# Patient Record
Sex: Female | Born: 2000 | Race: Black or African American | Hispanic: No | Marital: Single | Smoking: Never smoker
Health system: Southern US, Community
[De-identification: ages and names within clinical notes are randomized; demographics above are authoritative.]

---
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
## 2019-04-09 ENCOUNTER — Emergency Department (HOSPITAL_COMMUNITY)
Admission: EM | Admit: 2019-04-09 | Discharge: 2019-04-09 | Disposition: A | Discharge status: Home / Self Care | Payer: Self-pay | Attending: Emergency Medicine | Admitting: Emergency Medicine

## 2019-04-09 ENCOUNTER — Emergency Department (HOSPITAL_COMMUNITY): Payer: Self-pay

## 2019-04-09 ENCOUNTER — Encounter (HOSPITAL_COMMUNITY): Payer: Self-pay | Admitting: *Deleted

## 2019-04-09 DIAGNOSIS — W3400XA Accidental discharge from unspecified firearms or gun, initial encounter: Secondary | ICD-10-CM | POA: Insufficient documentation

## 2019-04-09 DIAGNOSIS — Z23 Encounter for immunization: Secondary | ICD-10-CM | POA: Insufficient documentation

## 2019-04-09 DIAGNOSIS — Y939 Activity, unspecified: Secondary | ICD-10-CM | POA: Insufficient documentation

## 2019-04-09 DIAGNOSIS — Y999 Unspecified external cause status: Secondary | ICD-10-CM | POA: Insufficient documentation

## 2019-04-09 DIAGNOSIS — Y929 Unspecified place or not applicable: Secondary | ICD-10-CM | POA: Insufficient documentation

## 2019-04-09 DIAGNOSIS — S42351B Displaced comminuted fracture of shaft of humerus, right arm, initial encounter for open fracture: Secondary | ICD-10-CM | POA: Insufficient documentation

## 2019-04-09 DIAGNOSIS — S42354B Nondisplaced comminuted fracture of shaft of humerus, right arm, initial encounter for open fracture: Secondary | ICD-10-CM

## 2019-04-09 LAB — CBC WITH DIFFERENTIAL/PLATELET
Abs Immature Granulocytes: 0.04 10*3/uL (ref 0.00–0.07)
Basophils Absolute: 0 10*3/uL (ref 0.0–0.1)
Basophils Relative: 0 %
Eosinophils Absolute: 0.1 10*3/uL (ref 0.0–0.5)
Eosinophils Relative: 1 %
HCT: 37.6 % (ref 36.0–46.0)
Hemoglobin: 12.6 g/dL (ref 12.0–15.0)
Immature Granulocytes: 0 %
Lymphocytes Relative: 31 %
Lymphs Abs: 3.3 10*3/uL (ref 0.7–4.0)
MCH: 32.1 pg (ref 26.0–34.0)
MCHC: 33.5 g/dL (ref 30.0–36.0)
MCV: 95.9 fL (ref 80.0–100.0)
Monocytes Absolute: 0.5 10*3/uL (ref 0.1–1.0)
Monocytes Relative: 5 %
Neutro Abs: 6.6 10*3/uL (ref 1.7–7.7)
Neutrophils Relative %: 63 %
Platelets: 226 10*3/uL (ref 150–400)
RBC: 3.92 MIL/uL (ref 3.87–5.11)
RDW: 11.9 % (ref 11.5–15.5)
WBC: 10.6 10*3/uL — ABNORMAL HIGH (ref 4.0–10.5)
nRBC: 0 % (ref 0.0–0.2)

## 2019-04-09 LAB — COMPREHENSIVE METABOLIC PANEL
ALT: 12 U/L (ref 0–44)
AST: 20 U/L (ref 15–41)
Albumin: 4 g/dL (ref 3.5–5.0)
Alkaline Phosphatase: 56 U/L (ref 38–126)
Anion gap: 14 (ref 5–15)
BUN: 20 mg/dL (ref 6–20)
CO2: 19 mmol/L — ABNORMAL LOW (ref 22–32)
Calcium: 8.8 mg/dL — ABNORMAL LOW (ref 8.9–10.3)
Chloride: 104 mmol/L (ref 98–111)
Creatinine, Ser: 0.87 mg/dL (ref 0.44–1.00)
GFR calc Af Amer: >60 mL/min (ref >60)
GFR calc non Af Amer: >60 mL/min (ref >60)
Glucose, Bld: 143 mg/dL — ABNORMAL HIGH (ref 70–99)
Potassium: 3 mmol/L — ABNORMAL LOW (ref 3.5–5.1)
Sodium: 137 mmol/L (ref 135–145)
Total Bilirubin: 0.5 mg/dL (ref 0.3–1.2)
Total Protein: 6.9 g/dL (ref 6.5–8.1)

## 2019-04-09 LAB — HCG, QUANTITATIVE, PREGNANCY: hCG, Beta Chain, Quant, S: <1 m[IU]/mL (ref <5)

## 2019-04-09 LAB — PROTIME-INR
INR: 1 (ref 0.8–1.2)
Prothrombin Time: 13.4 seconds (ref 11.4–15.2)

## 2019-04-09 MED ORDER — CEFAZOLIN SODIUM-DEXTROSE 1-4 GM/50ML-% IV SOLN
1.0000 g | Freq: Once | INTRAVENOUS | Status: AC
  Administered 2019-04-09: 1 g via INTRAVENOUS
  Filled 2019-04-09: qty 50

## 2019-04-09 MED ORDER — OXYCODONE-ACETAMINOPHEN 5-325 MG PO TABS: 2.0000 | ORAL_TABLET | ORAL | 0 refills | Status: DC | PRN

## 2019-04-09 MED ORDER — LIDOCAINE-EPINEPHRINE 1 %-1:100000 IJ SOLN: 20.0000 mL | Freq: Once | INTRAMUSCULAR | Status: DC

## 2019-04-09 MED ORDER — TETANUS-DIPHTH-ACELL PERTUSSIS 5-2.5-18.5 LF-MCG/0.5 IM SUSP
0.5000 mL | Freq: Once | INTRAMUSCULAR | Status: AC
  Administered 2019-04-09: 0.5 mL via INTRAMUSCULAR
  Filled 2019-04-09: qty 0.5

## 2019-04-09 MED ORDER — FENTANYL CITRATE (PF) 100 MCG/2ML IJ SOLN
50.0000 ug | Freq: Once | INTRAMUSCULAR | Status: AC
  Administered 2019-04-09: 50 ug via INTRAVENOUS
  Filled 2019-04-09: qty 2

## 2019-04-09 MED ORDER — HYDROMORPHONE HCL 1 MG/ML IJ SOLN
0.5000 mg | Freq: Once | INTRAMUSCULAR | Status: AC
  Administered 2019-04-09: 0.5 mg via INTRAVENOUS
  Filled 2019-04-09: qty 1

## 2019-04-09 MED ORDER — LIDOCAINE-EPINEPHRINE (PF) 2 %-1:200000 IJ SOLN
20.0000 mL | Freq: Once | INTRAMUSCULAR | Status: AC
  Administered 2019-04-09: 20 mL via INTRADERMAL

## 2019-04-09 MED ORDER — CEPHALEXIN 500 MG PO CAPS: 500.0000 mg | ORAL_CAPSULE | Freq: Four times a day (QID) | ORAL | 0 refills | Status: DC

## 2019-04-09 NOTE — Consult Note (Signed)
Responded to page, pt not available, no family present, prayed for pt, staff will page again if further need of chaplain services.   Rev. Gennette Shadix Chaplain 
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
## 2019-04-09 NOTE — Progress Notes (Signed)
Orthopedic Tech Progress Note Patient Details:  Lisa Carney 2001-01-30 [id number]  Ortho Devices Type of Ortho Device: Post (long arm) splint, Arm sling Ortho Device/Splint Location: rue Ortho Device/Splint Interventions: Ordered, Application, Adjustment  I applied the splint from hand to on top of shoulder as directed to by dr Magnus Ivan. Post Interventions Patient Tolerated: Well Instructions Provided: Care of device, Adjustment of device   [name] Post 04/09/2019, 4:56 AM

## 2019-04-10 ENCOUNTER — Encounter (HOSPITAL_COMMUNITY): Payer: Self-pay | Admitting: *Deleted

## 2019-04-18 ENCOUNTER — Other Ambulatory Visit: Payer: Self-pay

## 2019-04-18 ENCOUNTER — Ambulatory Visit (INDEPENDENT_AMBULATORY_CARE_PROVIDER_SITE_OTHER): Payer: Self-pay

## 2019-04-18 ENCOUNTER — Encounter: Payer: Self-pay | Admitting: Orthopaedic Surgery

## 2019-04-18 ENCOUNTER — Ambulatory Visit (INDEPENDENT_AMBULATORY_CARE_PROVIDER_SITE_OTHER): Payer: Self-pay | Admitting: Orthopaedic Surgery

## 2019-04-18 DIAGNOSIS — S42351A Displaced comminuted fracture of shaft of humerus, right arm, initial encounter for closed fracture: Secondary | ICD-10-CM | POA: Insufficient documentation
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
## 2019-08-11 ENCOUNTER — Encounter (HOSPITAL_COMMUNITY): Payer: Self-pay | Admitting: Emergency Medicine

## 2019-08-11 ENCOUNTER — Emergency Department (HOSPITAL_COMMUNITY)
Admission: EM | Admit: 2019-08-11 | Discharge: 2019-08-12 | Disposition: A | Discharge status: Home / Self Care | Payer: Medicaid Other | Attending: Emergency Medicine | Admitting: Emergency Medicine

## 2019-08-11 ENCOUNTER — Emergency Department (HOSPITAL_COMMUNITY): Payer: Medicaid Other

## 2019-08-11 ENCOUNTER — Other Ambulatory Visit: Payer: Self-pay

## 2019-08-11 DIAGNOSIS — Z5321 Procedure and treatment not carried out due to patient leaving prior to being seen by health care provider: Secondary | ICD-10-CM | POA: Diagnosis not present

## 2019-08-11 DIAGNOSIS — R0981 Nasal congestion: Secondary | ICD-10-CM | POA: Insufficient documentation

## 2019-08-11 LAB — CBC WITH DIFFERENTIAL/PLATELET
Abs Immature Granulocytes: 0.04 10*3/uL (ref 0.00–0.07)
Basophils Absolute: 0 10*3/uL (ref 0.0–0.1)
Basophils Relative: 0 %
Eosinophils Absolute: 0.3 10*3/uL (ref 0.0–0.5)
Eosinophils Relative: 3 %
HCT: 40.6 % (ref 36.0–46.0)
Hemoglobin: 14 g/dL (ref 12.0–15.0)
Immature Granulocytes: 0 %
Lymphocytes Relative: 12 %
Lymphs Abs: 1.2 10*3/uL (ref 0.7–4.0)
MCH: 31.9 pg (ref 26.0–34.0)
MCHC: 34.5 g/dL (ref 30.0–36.0)
MCV: 92.5 fL (ref 80.0–100.0)
Monocytes Absolute: 0.7 10*3/uL (ref 0.1–1.0)
Monocytes Relative: 7 %
Neutro Abs: 7.7 10*3/uL (ref 1.7–7.7)
Neutrophils Relative %: 78 %
Platelets: 175 10*3/uL (ref 150–400)
RBC: 4.39 MIL/uL (ref 3.87–5.11)
RDW: 12 % (ref 11.5–15.5)
WBC: 9.9 10*3/uL (ref 4.0–10.5)
nRBC: 0 % (ref 0.0–0.2)

## 2019-08-11 LAB — BASIC METABOLIC PANEL
Anion gap: 12 (ref 5–15)
BUN: 13 mg/dL (ref 6–20)
CO2: 21 mmol/L — ABNORMAL LOW (ref 22–32)
Calcium: 9.3 mg/dL (ref 8.9–10.3)
Chloride: 104 mmol/L (ref 98–111)
Creatinine, Ser: 1.03 mg/dL — ABNORMAL HIGH (ref 0.44–1.00)
GFR calc Af Amer: >60 mL/min (ref >60)
GFR calc non Af Amer: >60 mL/min (ref >60)
Glucose, Bld: 80 mg/dL (ref 70–99)
Potassium: 3.4 mmol/L — ABNORMAL LOW (ref 3.5–5.1)
Sodium: 137 mmol/L (ref 135–145)

## 2019-08-11 LAB — I-STAT BETA HCG BLOOD, ED (MC, WL, AP ONLY): I-stat hCG, quantitative: 6.3 m[IU]/mL — ABNORMAL HIGH (ref <5)

## 2019-08-11 NOTE — ED Triage Notes (Signed)
Patient reports chills , nasal congestion , dry cough with loss of smell and taste onset this week .

## 2019-08-12 NOTE — ED Notes (Signed)
Pt called multiple times for vs no answer
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
## 2019-11-18 ENCOUNTER — Encounter (HOSPITAL_COMMUNITY): Payer: Self-pay

## 2019-11-18 ENCOUNTER — Other Ambulatory Visit: Payer: Self-pay

## 2019-11-18 ENCOUNTER — Inpatient Hospital Stay (HOSPITAL_COMMUNITY)
Admission: AD | Admit: 2019-11-18 | Discharge: 2019-11-20 | DRG: 885 | Disposition: A | Discharge status: Home / Self Care | Payer: Medicaid Other | Source: Intra-hospital | Attending: Psychiatry | Admitting: Psychiatry

## 2019-11-18 ENCOUNTER — Emergency Department (HOSPITAL_COMMUNITY)
Admission: EM | Admit: 2019-11-18 | Discharge: 2019-11-18 | Disposition: A | Discharge status: Other Acute Inpatient Hospital | Payer: Medicaid Other | Attending: Emergency Medicine | Admitting: Emergency Medicine

## 2019-11-18 DIAGNOSIS — Z046 Encounter for general psychiatric examination, requested by authority: Secondary | ICD-10-CM

## 2019-11-18 DIAGNOSIS — Z915 Personal history of self-harm: Secondary | ICD-10-CM

## 2019-11-18 DIAGNOSIS — R45851 Suicidal ideations: Secondary | ICD-10-CM | POA: Insufficient documentation

## 2019-11-18 DIAGNOSIS — Z9119 Patient's noncompliance with other medical treatment and regimen: Secondary | ICD-10-CM

## 2019-11-18 DIAGNOSIS — F419 Anxiety disorder, unspecified: Secondary | ICD-10-CM | POA: Diagnosis present

## 2019-11-18 DIAGNOSIS — F1590 Other stimulant use, unspecified, uncomplicated: Secondary | ICD-10-CM | POA: Diagnosis not present

## 2019-11-18 DIAGNOSIS — F322 Major depressive disorder, single episode, severe without psychotic features: Secondary | ICD-10-CM | POA: Insufficient documentation

## 2019-11-18 DIAGNOSIS — Z20822 Contact with and (suspected) exposure to covid-19: Secondary | ICD-10-CM | POA: Diagnosis present

## 2019-11-18 DIAGNOSIS — Y636 Underdosing and nonadministration of necessary drug, medicament or biological substance: Secondary | ICD-10-CM | POA: Diagnosis not present

## 2019-11-18 DIAGNOSIS — T391X2A Poisoning by 4-Aminophenol derivatives, intentional self-harm, initial encounter: Secondary | ICD-10-CM

## 2019-11-18 DIAGNOSIS — R258 Other abnormal involuntary movements: Secondary | ICD-10-CM | POA: Insufficient documentation

## 2019-11-18 DIAGNOSIS — T391X5A Adverse effect of 4-Aminophenol derivatives, initial encounter: Secondary | ICD-10-CM | POA: Diagnosis not present

## 2019-11-18 DIAGNOSIS — F332 Major depressive disorder, recurrent severe without psychotic features: Secondary | ICD-10-CM | POA: Diagnosis not present

## 2019-11-18 DIAGNOSIS — T1491XA Suicide attempt, initial encounter: Secondary | ICD-10-CM

## 2019-11-18 LAB — COMPREHENSIVE METABOLIC PANEL
ALT: 13 U/L (ref 0–44)
AST: 17 U/L (ref 15–41)
Albumin: 4.3 g/dL (ref 3.5–5.0)
Alkaline Phosphatase: 64 U/L (ref 38–126)
Anion gap: 13 (ref 5–15)
BUN: 18 mg/dL (ref 6–20)
CO2: 21 mmol/L — ABNORMAL LOW (ref 22–32)
Calcium: 9.6 mg/dL (ref 8.9–10.3)
Chloride: 106 mmol/L (ref 98–111)
Creatinine, Ser: 0.98 mg/dL (ref 0.44–1.00)
GFR calc Af Amer: >60 mL/min (ref >60)
GFR calc non Af Amer: >60 mL/min (ref >60)
Glucose, Bld: 79 mg/dL (ref 70–99)
Potassium: 3.5 mmol/L (ref 3.5–5.1)
Sodium: 140 mmol/L (ref 135–145)
Total Bilirubin: 0.7 mg/dL (ref 0.3–1.2)
Total Protein: 7.7 g/dL (ref 6.5–8.1)

## 2019-11-18 LAB — CBC
HCT: 43.7 % (ref 36.0–46.0)
Hemoglobin: 14.8 g/dL (ref 12.0–15.0)
MCH: 31.4 pg (ref 26.0–34.0)
MCHC: 33.9 g/dL (ref 30.0–36.0)
MCV: 92.8 fL (ref 80.0–100.0)
Platelets: 191 10*3/uL (ref 150–400)
RBC: 4.71 MIL/uL (ref 3.87–5.11)
RDW: 11.9 % (ref 11.5–15.5)
WBC: 6.9 10*3/uL (ref 4.0–10.5)
nRBC: 0 % (ref 0.0–0.2)

## 2019-11-18 LAB — RAPID URINE DRUG SCREEN, HOSP PERFORMED
Amphetamines: NOT DETECTED
Barbiturates: NOT DETECTED
Benzodiazepines: NOT DETECTED
Cocaine: NOT DETECTED
Opiates: NOT DETECTED
Tetrahydrocannabinol: POSITIVE — AB

## 2019-11-18 LAB — SALICYLATE LEVEL: Salicylate Lvl: <7 mg/dL — ABNORMAL LOW (ref 7.0–30.0)

## 2019-11-18 LAB — I-STAT BETA HCG BLOOD, ED (MC, WL, AP ONLY): I-stat hCG, quantitative: <5 m[IU]/mL (ref <5)

## 2019-11-18 LAB — ACETAMINOPHEN LEVEL: Acetaminophen (Tylenol), Serum: 11 ug/mL (ref 10–30)

## 2019-11-18 LAB — SARS CORONAVIRUS 2 BY RT PCR (HOSPITAL ORDER, PERFORMED IN ~~LOC~~ HOSPITAL LAB): SARS Coronavirus 2: NEGATIVE

## 2019-11-18 LAB — MAGNESIUM: Magnesium: 1.7 mg/dL (ref 1.7–2.4)

## 2019-11-18 LAB — ETHANOL: Alcohol, Ethyl (B): <10 mg/dL (ref <10)

## 2019-11-18 MED ORDER — HYDROXYZINE HCL 25 MG PO TABS
25.0000 mg | ORAL_TABLET | Freq: Once | ORAL | Status: AC
  Administered 2019-11-18: 25 mg via ORAL
  Filled 2019-11-18 (×2): qty 1

## 2019-11-18 MED ORDER — SODIUM CHLORIDE 0.9 % IV BOLUS
1000.0000 mL | Freq: Once | INTRAVENOUS | Status: AC
  Administered 2019-11-18: 1000 mL via INTRAVENOUS

## 2019-11-18 MED ORDER — ALUM & MAG HYDROXIDE-SIMETH 200-200-20 MG/5ML PO SUSP: 30.0000 mL | ORAL | Status: DC | PRN

## 2019-11-18 MED ORDER — MAGNESIUM HYDROXIDE 400 MG/5ML PO SUSP: 30.0000 mL | Freq: Every day | ORAL | Status: DC | PRN

## 2019-11-18 MED ORDER — ACETAMINOPHEN 325 MG PO TABS: 650.0000 mg | ORAL_TABLET | Freq: Four times a day (QID) | ORAL | Status: DC | PRN

## 2019-11-18 MED ORDER — HALOPERIDOL LACTATE 5 MG/ML IJ SOLN
10.0000 mg | Freq: Once | INTRAMUSCULAR | Status: AC
  Administered 2019-11-18: 10 mg via INTRAMUSCULAR
  Filled 2019-11-18: qty 2

## 2019-11-18 NOTE — ED Provider Notes (Signed)
Medical Decision Making: Care of patient assumed from Dr. Rush Landmark at 1500.  Agree with history, physical exam and plan.  See their note for further details.  Briefly, The pt p/w self-harm with attempt of suicide by overdose of Tylenol and Benadryl, now medically cleared, behavioral health has IVC this patient is attempting to place them.   Current plan is as follows: IVC in place, attempting to de-escalate this patient, she is confrontational with nursing staff threatening to leave pacing in the hallways, she is offered oral medications to help calm her down but she declines she will be giving intramuscular injection of Haldol to cooperate and stay calm.  TTS has evaluated this patient, they deem her necessary for admission to [facility].   I personally reviewed and interpreted all labs/imaging.      Sabino Donovan, MD 11/18/19 2141

## 2019-11-18 NOTE — ED Notes (Signed)
Report called to Marisue Ivan, Charity fundraiser at [facility]

## 2019-11-18 NOTE — ED Notes (Signed)
[facility] left with pt

## 2019-11-18 NOTE — ED Notes (Signed)
PT request MD or RN to phone mother [phone] (tammy)

## 2019-11-18 NOTE — ED Notes (Signed)
[facility] called for transport of pt to [facility] Rm 300.

## 2019-11-18 NOTE — ED Notes (Signed)
Pt spoken to about her transfer to [facility]

## 2019-11-18 NOTE — ED Triage Notes (Signed)
Patient BIBEMS due to a suicide attempt, wrote a suicide note at 1930 9/17, called 911 twice, once at 0530 but hung up and again at 0600, EMS estimates that 5000mg  of tylenol and 2500mg  of benadryl was taken, patient arrives lethargic but alert gcs of 12
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
## 2019-11-18 NOTE — ED Notes (Signed)
Pt has been cooperative for the duration of this shift. No needs or concerns identified. Sitter has been at bedside

## 2019-11-18 NOTE — ED Notes (Signed)
Telehealth speaking with pt at this time

## 2019-11-18 NOTE — ED Notes (Addendum)
Pt belongings: [place] ID, Blk iphone, blk bractlet, blue shoes, blk and green pants,  Sport bra, white shirt, set gold earring

## 2019-11-18 NOTE — ED Provider Notes (Signed)
[facility]-EMERGENCY DEPT Provider Note   CSN: [id number] Arrival date & time: 11/18/19  6720     History Chief Complaint  Patient presents with  . Suicidal  . Drug Overdose    Lisa Carney is a 19 y.o. female.  The history is provided by the patient and medical records. No language interpreter was used.  Drug Overdose This is a new problem. The current episode started yesterday. The problem occurs constantly. The problem has been gradually improving. Pertinent negatives include no chest pain, no abdominal pain, no headaches and no shortness of breath. Nothing aggravates the symptoms. Nothing relieves the symptoms. She has tried nothing for the symptoms. The treatment provided no relief.           History reviewed. No pertinent past medical history.  Patient Active Problem List   Diagnosis Date Noted  . Closed displaced comminuted fracture of shaft of right humerus 04/18/2019    History reviewed. No pertinent surgical history.   OB History   No obstetric history on file.     Family History  Problem Relation Age of Onset  . Healthy Mother     Social History   Tobacco Use  . Smoking status: Never Smoker  . Smokeless tobacco: Never Used  Substance Use Topics  . Alcohol use: Yes  . Drug use: Yes    Types: Marijuana    Home Medications Prior to Admission medications   Medication Sig Start Date End Date Taking? Authorizing Provider  cephALEXin (KEFLEX) 500 MG capsule Take 1 capsule (500 mg total) by mouth 4 (four) times daily. 04/09/19   Mesner, Barbara Cower, MD  ipratropium (ATROVENT) 0.06 % nasal spray Place 2 sprays into both nostrils 4 (four) times daily. 10/25/17   Georgetta Haber, NP  oxyCODONE-acetaminophen (PERCOCET) 5-325 MG tablet Take 2 tablets by mouth every 4 (four) hours as needed. 04/09/19   Mesner, Barbara Cower, MD    Allergies    Patient has no known allergies.  Review of Systems   Review of Systems  Constitutional: Positive for  fatigue. Negative for chills, diaphoresis and fever.  HENT: Negative for congestion.   Eyes: Negative for visual disturbance.  Respiratory: Negative for cough, chest tightness, shortness of breath and wheezing.   Cardiovascular: Negative for chest pain, palpitations and leg swelling.  Gastrointestinal: Negative for abdominal pain, constipation, diarrhea, nausea and vomiting.  Genitourinary: Negative for dysuria and flank pain.  Musculoskeletal: Negative for back pain, neck pain and neck stiffness.  Skin: Negative for rash and wound.  Neurological: Negative for dizziness, syncope, speech difficulty, weakness, light-headedness and headaches.  Psychiatric/Behavioral: Negative for agitation and confusion.  All other systems reviewed and are negative.   Physical Exam Updated Vital Signs BP 138/82 (BP Location: Right Arm)   Pulse 92   Temp 98.5 F (36.9 C) (Oral)   Resp 18   Ht 5\' 9"  (1.753 m)   Wt 66 kg   SpO2 100%   BMI 21.49 kg/m   Physical Exam Vitals and nursing note reviewed.  Constitutional:      General: She is not in acute distress.    Appearance: She is well-developed. She is not ill-appearing, toxic-appearing or diaphoretic.  HENT:     Head: Normocephalic and atraumatic.     Nose: No congestion or rhinorrhea.     Mouth/Throat:     Mouth: Mucous membranes are moist.     Pharynx: No oropharyngeal exudate or posterior oropharyngeal erythema.  Eyes:     Extraocular Movements:  Extraocular movements intact.     Conjunctiva/sclera: Conjunctivae normal.     Pupils: Pupils are equal, round, and reactive to light.  Cardiovascular:     Rate and Rhythm: Normal rate and regular rhythm.     Pulses: Normal pulses.     Heart sounds: No murmur heard.   Pulmonary:     Effort: Pulmonary effort is normal. No respiratory distress.     Breath sounds: Normal breath sounds. No wheezing, rhonchi or rales.  Chest:     Chest wall: No tenderness.  Abdominal:     General: Abdomen is  flat.     Palpations: Abdomen is soft.     Tenderness: There is no abdominal tenderness. There is no right CVA tenderness, left CVA tenderness, guarding or rebound.  Musculoskeletal:        General: No tenderness.     Cervical back: Neck supple. No tenderness.     Right lower leg: No edema.     Left lower leg: No edema.  Skin:    General: Skin is warm and dry.     Capillary Refill: Capillary refill takes less than 2 seconds.     Findings: No erythema.  Neurological:     General: No focal deficit present.     Mental Status: She is alert.     Sensory: No sensory deficit.     Motor: No weakness.  Psychiatric:        Attention and Perception: Perception normal.        Mood and Affect: Mood is depressed.        Behavior: Behavior is withdrawn.        Thought Content: Thought content includes suicidal ideation. Thought content does not include homicidal ideation. Thought content includes suicidal plan. Thought content does not include homicidal plan.     ED Results / Procedures / Treatments   Labs (all labs ordered are listed, but only abnormal results are displayed) Labs Reviewed  COMPREHENSIVE METABOLIC PANEL - Abnormal; Notable for the following components:      Result Value   CO2 21 (*)    All other components within normal limits  SALICYLATE LEVEL - Abnormal; Notable for the following components:   Salicylate Lvl <7.0 (*)    All other components within normal limits  SARS CORONAVIRUS 2 BY RT PCR (HOSPITAL ORDER, PERFORMED IN [facility])  ETHANOL  ACETAMINOPHEN LEVEL  CBC  MAGNESIUM  RAPID URINE DRUG SCREEN, HOSP PERFORMED  I-STAT BETA HCG BLOOD, ED ([facility], [facility], [facility] ONLY)    EKG None ED ECG REPORT   Date: 11/18/2019  Rate: 65  Rhythm: normal sinus rhythm  QRS Axis: normal  Intervals: normal  ST/T Wave abnormalities: normal  Conduction Disutrbances:none  Narrative Interpretation:   Old EKG Reviewed: unchanged  I have personally reviewed the EKG tracing  and agree with the computerized printout as noted. .    Radiology No results found.  Procedures Procedures (including critical care time)  Medications Ordered in ED Medications  sodium chloride 0.9 % bolus 1,000 mL (0 mLs Intravenous Stopped 11/18/19 1128)    ED Course  I have reviewed the triage vital signs and the nursing notes.  Pertinent labs & imaging results that were available during my care of the patient were reviewed by me and considered in my medical decision making (see chart for details).    MDM Rules/Calculators/A&P  Jatoria Kneeland is a 19 y.o. female with no documented past medical history who presents with suicide attempt.  According to EMS report and nursing, patient took 5000 g of Tylenol and 2500 mg of Benadryl last night and has been sleepy this morning.  According to them, she called 911 twice this morning at 530 and 6 AM before them responding.  She will not answer questions but will shake her head yes and no to answers.  She wrote a suicide note which I have attached a picture of.  The note was written at 7:19 PM last night and she says around 730, just after finishing the note is when she took the medications.  She went to bed and after waking up call for help.  She currently denies any complaints aside from being tired.  She shakes her head no to any headache, neck pain, neck stiffness, chest pain, shortness of breath, nausea, vomiting, constipation, diarrhea, dysuria.  She denies hallucinations or homicidal ideation but does shake her head yes that she has had depression.  On exam, patient's vital signs are reassuring and she is not tachycardic or hypoxic.  She is not hypotensive.  She is resting comfortably.  On exam, she did have 1 or 2 beats of nystagmus but otherwise her pupils were not dilated and reacted normally.  Lungs clear and chest nontender.  Abdomen nontender.  No other focal neurologic deficits on initial exam.  No injury  seen.  I called poison control who reports that as she is around 12 hours since ingestion, and given her reassuring vital signs and EKG, they feel that if her labs returned reassuring, she is likely safe for medical clearance.  We added on all the blood work requested.  We will use a Tylenol level onto the nomogram.  EKG did not show STEMI.  I am placing her under IVC given this attempt with clear intent and reported depression.  Anticipate medical clearance after labs have returned.   Patient's work-up is returned and is reassuring.  Covid test is negative.  She is not pregnant.  Magnesium and metabolic panel reassuring.  CBC shows no leukocytosis or anemia.  Tylenol level is 11 and on the nomogram, is not other concerning level based on the timeframe ingestion.  Salicylate undetectable, alcohol undetectable.  Patient is now felt to be medically cleared given her reassuring vital signs and labs now being nearly 16 hours from ingestion.   Final Clinical Impression(s) / ED Diagnoses Final diagnoses:  Intentional acetaminophen overdose, initial encounter (HCC)  Suicide attempt [facility])  Involuntary commitment    Clinical Impression: 1. Intentional acetaminophen overdose, initial encounter (HCC)   2. Suicide attempt (HCC)   3. Involuntary commitment     Disposition: Awaiting TTS recommendations under IVC currently.  This note was prepared with assistance of Conservation officer, historic buildings. Occasional wrong-word or sound-a-like substitutions may have occurred due to the inherent limitations of voice recognition software.      Paisley Grajeda, Canary Brim, MD 11/18/19 [phone]

## 2019-11-18 NOTE — Tx Team (Signed)
Initial Treatment Plan 11/18/2019 11:39 PM Lisa Carney PGF:[id number]    PATIENT STRESSORS: Legal issue Marital or family conflict Traumatic event   PATIENT STRENGTHS: Average or above average intelligence Communication skills Supportive family/friends Work skills   PATIENT IDENTIFIED PROBLEMS: Depression  Suicidal Ideation          "anger management"         DISCHARGE CRITERIA:  Improved stabilization in mood, thinking, and/or behavior Motivation to continue treatment in a less acute level of care Need for constant or close observation no longer present Verbal commitment to aftercare and medication compliance  PRELIMINARY DISCHARGE PLAN: Outpatient therapy Placement in alternative living arrangements Return to previous work or school arrangements  PATIENT/FAMILY INVOLVEMENT: This treatment plan has been presented to and reviewed with the patient, Lisa Carney,.  The patient and family have been given the opportunity to ask questions and make suggestions.  Juliann Pares, RN 11/18/2019, 11:39 PM

## 2019-11-19 MED ORDER — HYDROXYZINE HCL 25 MG PO TABS
25.0000 mg | ORAL_TABLET | Freq: Three times a day (TID) | ORAL | Status: DC | PRN
  Administered 2019-11-19: 25 mg via ORAL
  Filled 2019-11-19: qty 1

## 2019-11-19 MED ORDER — TRAZODONE HCL 50 MG PO TABS: 50.0000 mg | ORAL_TABLET | Freq: Every evening | ORAL | Status: DC | PRN

## 2019-11-19 NOTE — B[facility] Group Notes (Signed)
Psychoeducational Group Note  Date:  11-19-19 Time:  1300  Group Topic/Focus:  Making Healthy Choices:   The focus of this group is to help patients identify negative/unhealthy choices they were using prior to admission and identify positive/healthier coping strategies to replace them upon discharge.  Participation Level:  Active  Participation Quality:  Appropriate  Affect:  Appropriate  Cognitive:  Oriented  Insight:  Improving  Engagement in Group:  Engaged  Additional Comments:  Pt rates her energy a 6/10. Was engaged with paying attention, but did not add much to the discussion  Dione Housekeeper

## 2019-11-19 NOTE — BHH Group Notes (Signed)
Adult Psychoeducational Group Not Date:  11/19/2019 Time:  1000-1045 Group Topic/Focus: PROGRESSIVE RELAXATION. A group where deep breathing is taught and tensing and relaxation muscle groups is used. Imagery is used as well.  Pts are asked to imagine 3 pillars that hold them up when they are not able to hold themselves up.  Participation Level:  Did not attend   Lisa Carney A 11/19/2019  

## 2019-11-19 NOTE — Progress Notes (Signed)
   11/19/19 2210  COVID-19 Daily Checkoff  Have you had a fever (temp > 37.80C/100F)  in the past 24 hours?  No  If you have had runny nose, nasal congestion, sneezing in the past 24 hours, has it worsened? No  COVID-19 EXPOSURE  Have you traveled outside the state in the past 14 days? No  Have you been in contact with someone with a confirmed diagnosis of COVID-19 or PUI in the past 14 days without wearing appropriate PPE? No  Have you been living in the same home as a person with confirmed diagnosis of COVID-19 or a PUI (household contact)? No  Have you been diagnosed with COVID-19? No

## 2019-11-19 NOTE — Progress Notes (Signed)
Psychoeducational Group Note  Date:  11/19/2019 Time:  2015  Group Topic/Focus:  wra up group  Participation Level: Did Not Attend  Participation Quality:  Not Applicable  Affect:  Not Applicable  Cognitive:  Not Applicable  Insight:  Not Applicable  Engagement in Group: Not Applicable  Additional Comments:  Pt was notified that group was beginning but remained in bed.   Marcille Buffy 11/19/2019, 9:30 PM

## 2019-11-19 NOTE — Progress Notes (Signed)
   11/19/19 2213  Psych Admission Type (Psych Patients Only)  Admission Status Involuntary  Psychosocial Assessment  Patient Complaints Insomnia  Eye Contact Brief  Facial Expression Flat  Affect Appropriate to circumstance  Speech Logical/coherent  Interaction Assertive  Motor Activity Other (Comment) (WDL)  Appearance/Hygiene In scrubs  Behavior Characteristics Appropriate to situation  Mood Pleasant  Thought Process  Coherency WDL  Content WDL  Delusions None reported or observed  Perception WDL  Hallucination None reported or observed  Judgment Poor  Confusion None  Danger to Self  Current suicidal ideation? Denies  Danger to Others  Danger to Others None reported or observed
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
## 2019-11-19 NOTE — [facility] Group Notes (Signed)
Adult Psychoeducational Group Not Date:  11/19/2019 Time:  1000-1045 Group Topic/Focus: PROGRESSIVE RELAXATION. A group where deep breathing is taught and tensing and relaxation muscle groups is used. Imagery is used as well.  Pts are asked to imagine 3 pillars that hold them up when they are not able to hold themselves up.  Participation Level:  Did not attend   Legaci Tarman A 11/19/2019  

## 2019-11-19 NOTE — Progress Notes (Signed)
Patient assessed in her room while lying down in bed this morning. She reports "no" SI/HI. She denies AVH. She reports sleeping well at night. She denies feeing depressed or anxious this morning.   She reports attempting to overdose because she felt "alone." We discussed healthy support persons and she stated that she doesn't have a support system. When asked if she has a good relationship with her mother, she stated "yes" and then identified her mother as supportive. She appears guarded and forwards little information. She requires prompting to answer questions. She is isolative to her room and appears withdrawn.   Orders reviewed with the pt. V/s reviewed. Verbal support provided. Pt encouraged to attend groups. 15 minute checks performed for safety.   No concerns verbalized by the pt. Noncompliant with attending groups.

## 2019-11-20 NOTE — [facility] Suicide Risk Assessment (Signed)
[facility] INPATIENT:  Family/Significant Other Suicide Prevention Education  Suicide Prevention Education:  Education Completed; Lisa Carney (mother), [phone], has been identified by the patient as the family member/significant other with whom the patient will be residing, and identified as the person(s) who will aid the patient in the event of a mental health crisis (suicidal ideations/suicide attempt).  With written consent from the patient, the family member/significant other has been provided the following suicide prevention education, prior to the and/or following the discharge of the patient.  The suicide prevention education provided includes the following:  Suicide risk factors  Suicide prevention and interventions  National Suicide Hotline telephone number  [facility] assessment telephone number  [facility] Emergency Assistance 911  [facility] and/or Residential Mobile Crisis Unit telephone number  Request made of family/significant other to:  Remove weapons (e.g., guns, rifles, knives), all items previously/currently identified as safety concern.    Remove drugs/medications (over-the-counter, prescriptions, illicit drugs), all items previously/currently identified as a safety concern.  CSW spoke with Pt's mother, Lisa Carney. She expressed concerns regarding Pt's discharge, however after further discussion she was understanding of Pt meeting maximum benefit of her in-patient stay. Mother shared concerns regarding Pt engaging int the same problematic behaviors post discharge. CSW explained that Pt has been referred for follow-up services, specific to anger management. Mother does not feel such a service will be enough; CSW explained that this referral is the most appropriate for Pt at this time. Mother did confirm that Pt can return to the home, however this will be temporary. CSW agreed to reiterate this to Pt. SPE was completed with specific emphasis on  information outlined in [employer] pamphlet.The family member/significant other verbalizes understanding of the suicide prevention education information provided.The family member/significant other agrees to remove the items of safety concern listed above.  Jacinta Shoe, MSW, LCSW 11/20/2019, 10:49 AM

## 2019-11-20 NOTE — Progress Notes (Signed)
  [facility] Adult Case Management Discharge Plan :  Will you be returning to the same living situation after discharge:  Yes,  with mother. At discharge, do you have transportation home?: Yes,  grandmother will provider transportation at discharge. Do you have the ability to pay for your medications: Yes,  Pt is insured.  Release of information consent forms completed and in the chart;  Patient's signature needed at discharge.  Patient to Follow up at:  Follow-up Information    [facility]. Go on 11/25/2019.   Specialty: Behavioral Health Why: You have a walk in appointment for anger management therapy on 11/25/19 at 11:45 am with [facility].  You also have an appointment for individual therapy on 11/27/19 at 3:45 pm.   These appointments will be held in person.   Contact information: [phone] [place] [address] [phone]              Next level of care provider has access to [facility] Link:yes  Safety Planning and Suicide Prevention discussed: Yes,  completed with mother on 11/20/2019  Have you used any form of tobacco in the last 30 days? (Cigarettes, Smokeless Tobacco, Cigars, and/or Pipes): No  Has patient been referred to the Quitline?: N/A patient is not a smoker  Patient has been referred for addiction treatment: N/A  Jacinta Shoe, LCSW 11/20/2019, 10:54 AM

## 2019-11-20 NOTE — Tx Team (Signed)
Interdisciplinary Treatment and Diagnostic Plan Update  11/20/2019 Time of Session: 9:00am Lisa Carney MRN: [id number]  Principal Diagnosis: <principal problem not specified>  Secondary Diagnoses: Active Problems:   MDD (major depressive disorder), recurrent severe, without psychosis (HCC)   Current Medications:  Current Facility-Administered Medications  Medication Dose Route Frequency Provider Last Rate Last Admin  . acetaminophen (TYLENOL) tablet 650 mg  650 mg Oral Q6H PRN Gillermo Murdoch, NP      . alum & mag hydroxide-simeth (MAALOX/MYLANTA) 200-200-20 MG/5ML suspension 30 mL  30 mL Oral Q4H PRN Gillermo Murdoch, NP      . hydrOXYzine (ATARAX/VISTARIL) tablet 25 mg  25 mg Oral TID PRN Antonieta Pert, MD   25 mg at 11/19/19 2117  . magnesium hydroxide (MILK OF MAGNESIA) suspension 30 mL  30 mL Oral Daily PRN Gillermo Murdoch, NP      . traZODone (DESYREL) tablet 50 mg  50 mg Oral QHS PRN Antonieta Pert, MD       PTA Medications: Medications Prior to Admission  Medication Sig Dispense Refill Last Dose  . cephALEXin (KEFLEX) 500 MG capsule Take 1 capsule (500 mg total) by mouth 4 (four) times daily. (Patient not taking: Reported on 11/18/2019) 40 capsule 0   . ipratropium (ATROVENT) 0.06 % nasal spray Place 2 sprays into both nostrils 4 (four) times daily. (Patient not taking: Reported on 11/18/2019) 15 mL 12   . oxyCODONE-acetaminophen (PERCOCET) 5-325 MG tablet Take 2 tablets by mouth every 4 (four) hours as needed. (Patient not taking: Reported on 11/18/2019) 20 tablet 0     Patient Stressors: Legal issue Marital or family conflict Traumatic event  Patient Strengths: Average or above average intelligence Communication skills Supportive family/friends Work skills  Treatment Modalities: Medication Management, Group therapy, Case management,  1 to 1 session with clinician, Psychoeducation, Recreational therapy.   Physician Treatment Plan for Primary  Diagnosis: <principal problem not specified> Long Term Goal(s): Improvement in symptoms so as ready for discharge Improvement in symptoms so as ready for discharge   Short Term Goals: Ability to identify changes in lifestyle to reduce recurrence of condition will improve Ability to verbalize feelings will improve Ability to disclose and discuss suicidal ideas Ability to demonstrate self-control will improve Ability to identify and develop effective coping behaviors will improve Ability to maintain clinical measurements within normal limits will improve Ability to identify triggers associated with substance abuse/mental health issues will improve Ability to identify changes in lifestyle to reduce recurrence of condition will improve Ability to verbalize feelings will improve Ability to disclose and discuss suicidal ideas Ability to demonstrate self-control will improve Ability to identify and develop effective coping behaviors will improve Ability to maintain clinical measurements within normal limits will improve Ability to identify triggers associated with substance abuse/mental health issues will improve  Medication Management: Evaluate patient's response, side effects, and tolerance of medication regimen.  Therapeutic Interventions: 1 to 1 sessions, Unit Group sessions and Medication administration.  Evaluation of Outcomes: Adequate for Discharge  Physician Treatment Plan for Secondary Diagnosis: Active Problems:   MDD (major depressive disorder), recurrent severe, without psychosis (HCC)  Long Term Goal(s): Improvement in symptoms so as ready for discharge Improvement in symptoms so as ready for discharge   Short Term Goals: Ability to identify changes in lifestyle to reduce recurrence of condition will improve Ability to verbalize feelings will improve Ability to disclose and discuss suicidal ideas Ability to demonstrate self-control will improve Ability to identify and  develop effective coping behaviors  will improve Ability to maintain clinical measurements within normal limits will improve Ability to identify triggers associated with substance abuse/mental health issues will improve Ability to identify changes in lifestyle to reduce recurrence of condition will improve Ability to verbalize feelings will improve Ability to disclose and discuss suicidal ideas Ability to demonstrate self-control will improve Ability to identify and develop effective coping behaviors will improve Ability to maintain clinical measurements within normal limits will improve Ability to identify triggers associated with substance abuse/mental health issues will improve     Medication Management: Evaluate patient's response, side effects, and tolerance of medication regimen.  Therapeutic Interventions: 1 to 1 sessions, Unit Group sessions and Medication administration.  Evaluation of Outcomes: Adequate for Discharge   RN Treatment Plan for Primary Diagnosis: <principal problem not specified> Long Term Goal(s): Knowledge of disease and therapeutic regimen to maintain health will improve  Short Term Goals: Ability to verbalize frustration and anger appropriately will improve, Ability to demonstrate self-control, Ability to verbalize feelings will improve, Ability to identify and develop effective coping behaviors will improve and Compliance with prescribed medications will improve  Medication Management: RN will administer medications as ordered by provider, will assess and evaluate patient's response and provide education to patient for prescribed medication. RN will report any adverse and/or side effects to prescribing provider.  Therapeutic Interventions: 1 on 1 counseling sessions, Psychoeducation, Medication administration, Evaluate responses to treatment, Monitor vital signs and CBGs as ordered, Perform/monitor CIWA, COWS, AIMS and Fall Risk screenings as ordered, Perform wound  care treatments as ordered.  Evaluation of Outcomes: Adequate for Discharge   LCSW Treatment Plan for Primary Diagnosis: <principal problem not specified> Long Term Goal(s): Safe transition to appropriate next level of care at discharge, Engage patient in therapeutic group addressing interpersonal concerns.  Short Term Goals: Engage patient in aftercare planning with referrals and resources, Increase social support, Increase ability to appropriately verbalize feelings, Increase emotional regulation and Increase skills for wellness and recovery  Therapeutic Interventions: Assess for all discharge needs, 1 to 1 time with Social worker, Explore available resources and support systems, Assess for adequacy in community support network, Educate family and significant other(s) on suicide prevention, Complete Psychosocial Assessment, Interpersonal group therapy.  Evaluation of Outcomes: Adequate for Discharge   Progress in Treatment: Attending groups: Yes. Participating in groups: Yes. Taking medication as prescribed: Yes. Toleration medication: Yes. Family/Significant other contact made: Yes, individual(s) contacted:  mother Patient understands diagnosis: Yes. Discussing patient identified problems/goals with staff: Yes. Medical problems stabilized or resolved: Yes. Denies suicidal/homicidal ideation: Yes. Issues/concerns per patient self-inventory: No.   New problem(s) identified: No, Describe:  none  New Short Term/Long Term Goal(s): medication stabilization, elimination of SI thoughts, emotion regulation, development of comprehensive mental wellness plan.   Patient Goals:  "To work on anger"  Discharge Plan or Barriers: To return home with mother and to follow up with outpatient providers for therapy and medication management.   Reason for Continuation of Hospitalization: Medication stabilization  Estimated Length of Stay: Adequate for Discharge  Attendees: Patient: Lisa Carney   11/20/2019   Physician:  11/20/2019   Nursing:  11/20/2019   RN Care Manager: 11/20/2019  Social Worker: Ruthann Cancer, LCSW 11/20/2019   Recreational Therapist:  11/20/2019   Other: Tedra Coupe, NP 11/20/2019   Other: Melba Coon, LCSW 11/20/2019   Other: 11/20/2019     Scribe for Treatment Team: Otelia Santee, LCSW 11/20/2019 10:39 AM

## 2019-11-20 NOTE — BHH Suicide Risk Assessment (Signed)
[facility] Discharge Suicide Risk Assessment   Principal Problem: <principal problem not specified> Discharge Diagnoses: Active Problems:   MDD (major depressive disorder), recurrent severe, without psychosis (HCC)   Total Time spent with patient: 20 minutes  Musculoskeletal: Strength & Muscle Tone: within normal limits Gait & Station: normal Patient leans: N/A  Psychiatric Specialty Exam: Review of Systems  All other systems reviewed and are negative.   Blood pressure 117/72, pulse (!) 117, temperature 98.6 F (37 C), temperature source Oral, resp. rate 18, height 5\' 8"  (1.727 m), weight 67.6 kg, last menstrual period 11/04/2019, SpO2 99 %.Body mass index is 22.66 kg/m.  General Appearance: Casual  Eye Contact::  Fair  Speech:  Normal Rate409  Volume:  Normal  Mood:  Anxious and Irritable  Affect:  Congruent  Thought Process:  Coherent and Descriptions of Associations: Intact  Orientation:  Full (Time, Place, and Person)  Thought Content:  Logical  Suicidal Thoughts:  No  Homicidal Thoughts:  No  Memory:  Immediate;   Fair Recent;   Fair Remote;   Fair  Judgement:  Intact  Insight:  Fair  Psychomotor Activity:  Normal  Concentration:  Fair  Recall:  [id number] of Knowledge:Fair  Language: Good  Akathisia:  Negative  Handed:  Right  AIMS (if indicated):     Assets:  Desire for Improvement Resilience Social Support  Sleep:  Number of Hours: 6.75  Cognition: WNL  ADL's:  Intact   Mental Status Per Nursing Assessment::   On Admission:  Suicidal ideation indicated by others, Suicide plan, Intention to act on suicide plan, Self-harm behaviors, Suicidal ideation indicated by patient  Demographic Factors:  Gay, lesbian, or bisexual orientation, Low socioeconomic status and Living alone  Loss Factors: Loss of significant relationship  Historical Factors: Impulsivity  Risk Reduction Factors:   Positive social support  Continued Clinical Symptoms:  Depression:    Impulsivity  Cognitive Features That Contribute To Risk:  None    Suicide Risk:  Minimal: No identifiable suicidal ideation.  Patients presenting with no risk factors but with morbid ruminations; may be classified as minimal risk based on the severity of the depressive symptoms   Follow-up Information    [facility]. Go on 11/25/2019.   Specialty: Behavioral Health Why: You have a walk in appointment for anger management therapy on 11/25/19 at 11:45 am with [facility].  You also have an appointment for individual therapy on 11/27/19 at 3:45 pm.   These appointments will be held in person.   Contact information: [address] [employer] [place] [phone]              Plan Of Care/Follow-up recommendations:  Activity:  ad lib  [phone], MD 11/20/2019, 10:26 AM

## 2019-11-20 NOTE — Progress Notes (Signed)
Recreation Therapy Notes  Date:  9.20.21 Time: 0930 Location: [address] Dayroom  Group Topic: Stress Management  Goal Area(s) Addresses:  Patient will identify positive stress management techniques. Patient will identify benefits of using stress management post d/c.  Intervention: Stress Management  Activity:  Meditation.  LRT played a meditation that focused on making the most of your day and making the most of each moment.  Patients were to listen and follow as meditation played to fully engage in activity.    Education:  Stress Management, Discharge Planning.   Education Outcome: Acknowledges Education  Clinical Observations/Feedback: Pt did not attend group activity.    Caroll Rancher, LRT/CTRS         Lillia Abed, Jourdain Guay A 11/20/2019 11:07 AM

## 2019-11-20 NOTE — Plan of Care (Signed)
Nurse discussed anxiety, depression and coping skills with patient.  
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
## 2019-11-20 NOTE — Progress Notes (Signed)
Discharge Note:  Patient was discharged home with mother.  Patient denied SI and HI.  Denied A/V hallucinations.  Suicide prevention information given to patient who stated she understood and had no questions.  Patient stated she received all her belongings, clothing, toiletries, misc items, etc.  Patient stated she appreciated all assistance received from [facility] staff.  All required discharge information given to patient at discharge.

## 2019-11-20 NOTE — Progress Notes (Signed)
D:  Patient denied SI and HI, contracts for safety.  Denied A/V hallucinations.  Denied pain. A:  Medications administered per MD orders.  Emotional support and encouragement given patient. R:  Safety maintained with 15 minute checks.  

## 2021-01-24 ENCOUNTER — Emergency Department (HOSPITAL_COMMUNITY)
Admission: EM | Admit: 2021-01-24 | Discharge: 2021-01-24 | Disposition: A | Discharge status: Home / Self Care | Payer: Medicaid Other | Attending: Emergency Medicine | Admitting: Emergency Medicine

## 2021-01-24 ENCOUNTER — Encounter (HOSPITAL_COMMUNITY): Payer: Self-pay

## 2021-01-24 ENCOUNTER — Other Ambulatory Visit: Payer: Self-pay

## 2021-01-24 DIAGNOSIS — U071 COVID-19: Secondary | ICD-10-CM | POA: Diagnosis not present

## 2021-01-24 DIAGNOSIS — R509 Fever, unspecified: Secondary | ICD-10-CM | POA: Diagnosis present

## 2021-01-24 DIAGNOSIS — R112 Nausea with vomiting, unspecified: Secondary | ICD-10-CM

## 2021-01-24 LAB — CBC WITH DIFFERENTIAL/PLATELET
Abs Immature Granulocytes: 0.02 10*3/uL (ref 0.00–0.07)
Basophils Absolute: 0 10*3/uL (ref 0.0–0.1)
Basophils Relative: 0 %
Eosinophils Absolute: 0 10*3/uL (ref 0.0–0.5)
Eosinophils Relative: 0 %
HCT: 39.9 % (ref 36.0–46.0)
Hemoglobin: 13.9 g/dL (ref 12.0–15.0)
Immature Granulocytes: 0 %
Lymphocytes Relative: 12 %
Lymphs Abs: 0.5 10*3/uL — ABNORMAL LOW (ref 0.7–4.0)
MCH: 33.1 pg (ref 26.0–34.0)
MCHC: 34.8 g/dL (ref 30.0–36.0)
MCV: 95 fL (ref 80.0–100.0)
Monocytes Absolute: 0.4 10*3/uL (ref 0.1–1.0)
Monocytes Relative: 9 %
Neutro Abs: 3.5 10*3/uL (ref 1.7–7.7)
Neutrophils Relative %: 79 %
Platelets: 148 10*3/uL — ABNORMAL LOW (ref 150–400)
RBC: 4.2 MIL/uL (ref 3.87–5.11)
RDW: 11.9 % (ref 11.5–15.5)
WBC: 4.5 10*3/uL (ref 4.0–10.5)
nRBC: 0 % (ref 0.0–0.2)

## 2021-01-24 LAB — URINALYSIS, ROUTINE W REFLEX MICROSCOPIC
Bacteria, UA: NONE SEEN
Bilirubin Urine: NEGATIVE
Glucose, UA: NEGATIVE mg/dL
Ketones, ur: 5 mg/dL — AB
Leukocytes,Ua: NEGATIVE
Nitrite: NEGATIVE
Protein, ur: NEGATIVE mg/dL
Specific Gravity, Urine: 1.023 (ref 1.005–1.030)
pH: 5 (ref 5.0–8.0)

## 2021-01-24 LAB — COMPREHENSIVE METABOLIC PANEL
ALT: 14 U/L (ref 0–44)
AST: 21 U/L (ref 15–41)
Albumin: 4.4 g/dL (ref 3.5–5.0)
Alkaline Phosphatase: 54 U/L (ref 38–126)
Anion gap: 10 (ref 5–15)
BUN: 11 mg/dL (ref 6–20)
CO2: 22 mmol/L (ref 22–32)
Calcium: 9 mg/dL (ref 8.9–10.3)
Chloride: 102 mmol/L (ref 98–111)
Creatinine, Ser: 0.98 mg/dL (ref 0.44–1.00)
GFR, Estimated: >60 mL/min (ref >60)
Glucose, Bld: 91 mg/dL (ref 70–99)
Potassium: 3 mmol/L — ABNORMAL LOW (ref 3.5–5.1)
Sodium: 134 mmol/L — ABNORMAL LOW (ref 135–145)
Total Bilirubin: 0.6 mg/dL (ref 0.3–1.2)
Total Protein: 7.7 g/dL (ref 6.5–8.1)

## 2021-01-24 LAB — RESP PANEL BY RT-PCR (FLU A&B, COVID) ARPGX2
Influenza A by PCR: NEGATIVE
Influenza B by PCR: NEGATIVE
SARS Coronavirus 2 by RT PCR: POSITIVE — AB

## 2021-01-24 LAB — I-STAT BETA HCG BLOOD, ED (MC, WL, AP ONLY): I-stat hCG, quantitative: <5 m[IU]/mL (ref <5)

## 2021-01-24 MED ORDER — POTASSIUM CHLORIDE CRYS ER 20 MEQ PO TBCR
40.0000 meq | EXTENDED_RELEASE_TABLET | Freq: Once | ORAL | Status: AC
  Administered 2021-01-24: 40 meq via ORAL
  Filled 2021-01-24: qty 2

## 2021-01-24 MED ORDER — IBUPROFEN 600 MG PO TABS: 600.0000 mg | ORAL_TABLET | Freq: Four times a day (QID) | ORAL | 0 refills | Status: AC | PRN

## 2021-01-24 MED ORDER — ONDANSETRON 4 MG PO TBDP: 4.0000 mg | ORAL_TABLET | Freq: Three times a day (TID) | ORAL | 0 refills | Status: AC | PRN

## 2021-01-24 MED ORDER — ACETAMINOPHEN 325 MG PO TABS
650.0000 mg | ORAL_TABLET | Freq: Once | ORAL | Status: AC
  Administered 2021-01-24: 650 mg via ORAL
  Filled 2021-01-24: qty 2

## 2021-01-24 NOTE — ED Notes (Signed)
An After Visit Summary was printed and given to the patient. Discharge instructions given and no further questions at this time.  

## 2021-01-24 NOTE — ED Provider Notes (Signed)
Emergency Medicine Provider Triage Evaluation Note  Lisa Carney , a 20 y.o. female  was evaluated in triage.  Pt complains of fever, emesis.  Temp up to 101 began yesterday.  Some myalgias, multiple episodes of NBNB emesis.  No associated abd pain, urinary symptoms.  Intermittent cough, no chest pain, shortness of breath.  History of Tylenol overdose however denies any recent Tylenol/ Ibuprofen use.  Did take NyQuil at 6 AM this morning per significant other.  Review of Systems  Positive: Fever, emesis Negative: Chest pain, shortness of breath, abdominal pain, urinary symptom  Physical Exam  BP 127/78 (BP Location: Left Arm)   Pulse 96   Temp (!) 100.9 F (38.3 C) (Oral)   Resp 18   SpO2 96%  Gen:   Awake, no distress   Resp:  Normal effort  MSK:   Moves extremities without difficulty  Other:    Medical Decision Making  Medically screening exam initiated at 1:56 PM.  Appropriate orders placed.  Lisa Carney was informed that the remainder of the evaluation will be completed by another provider, this initial triage assessment does not replace that evaluation, and the importance of remaining in the ED until their evaluation is complete.  Fever, emesis   Lisa Carney A, PA-C 01/24/21 1358    [name], MD 01/24/21 1616

## 2021-01-24 NOTE — ED Triage Notes (Signed)
Patient c/o fever, sore throat, body aches, vomiting, sore throat since yesterday.

## 2021-01-24 NOTE — Discharge Instructions (Addendum)
Viral Illness TREATMENT  Your nasal swab was positive for COVID-19 today.  This is a viral illness will resolve with time. Please drink plenty of water and take Tylenol and ibuprofen as discussed below Please use Tylenol or ibuprofen for pain.  You may use 600 mg ibuprofen every 6 hours or 1000 mg of Tylenol every 6 hours.  You may choose to alternate between the 2.  This would be most effective.  Not to exceed 4 g of Tylenol within 24 hours.  Not to exceed 3200 mg ibuprofen 24 hours.   Take Zofran as needed for nausea.    Treatment is directed at relieving symptoms. There is no cure. Antibiotics are not effective, because the infection is caused by a virus, not by bacteria. Treatment may include:  Increased fluid intake. Sports drinks offer valuable electrolytes, sugars, and fluids.  Breathing heated mist or steam (vaporizer or shower).  Eating chicken soup or other clear broths, and maintaining good nutrition.  Getting plenty of rest.  Using gargles or lozenges for comfort.  Increasing usage of your inhaler if you have asthma.  Return to work when your temperature has returned to normal.  Gargle warm salt water and spit it out for sore throat. Take benadryl to decrease sinus secretions. Continue to alternate between Tylenol and ibuprofen for pain and fever control.  Follow Up: Follow up with your primary care doctor in 5-7 days for recheck of ongoing symptoms.  Return to emergency department for emergent changing or worsening of symptoms.

## 2021-01-24 NOTE — ED Provider Notes (Signed)
[facility] DEPT Provider Note   CSN: [id number] Arrival date & time: 01/24/21  1349     History Chief Complaint  Patient presents with   Fever   Cough   Sore Throat   Emesis   Generalized Body Aches    Lisa Carney is a 20 y.o. female.  HPI Patient is a 20 year old female presented to the emergency room today with complaints of fever, nausea, several episodes of retching and 2 episodes of vomiting nonbloody nonbilious emesis earlier today.  She states that she occasionally is coughing denies any chest pain or difficulty breathing.  She states that she has been using NyQuil occasionally for her symptoms last used 16 this morning.  No other medications used.  Does endorse some myalgias  States that she has been around many people recently for Thanksgiving festivities.  Denies any known exposure to viral illnesses such as flu or COVID.  No other associate symptoms.  No aggravating or mitigating factors.  Improved after Tylenol that was given in triage.  She is vaccinated against COVID has not is uncertain about whether she received a booster.    History reviewed. No pertinent past medical history.  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 No pertinent surgical history.   OB History   No obstetric history on file.     Family History  Problem Relation Age of Onset   Healthy Mother     Social History   Tobacco Use   Smoking status: Never   Smokeless tobacco: Never  Vaping Use   Vaping Use: Never used  Substance Use Topics   Alcohol use: Yes   Drug use: Yes    Types: Marijuana    Home Medications Prior to Admission medications   Medication Sig Start Date End Date Taking? Authorizing Provider  ibuprofen (ADVIL) 600 MG tablet Take 1  tablet (600 mg total) by mouth every 6 (six) hours as needed. 01/24/21  Yes Mariadejesus Cade S, PA  ondansetron (ZOFRAN-ODT) 4 MG disintegrating tablet Take 1 tablet (4 mg total) by mouth every 8 (eight) hours as needed for nausea or vomiting. 01/24/21  Yes Tedd Sias, PA    Allergies    Patient has no known allergies.  Review of Systems   Review of Systems  Constitutional:  Positive for chills, fatigue and fever.  HENT:  Positive for congestion, postnasal drip, rhinorrhea and sore throat.   Eyes:  Negative for redness.  Respiratory:  Positive for cough. Negative for shortness of breath.   Cardiovascular:  Negative for chest pain and leg swelling.  Gastrointestinal:  Positive for nausea and vomiting. Negative for abdominal pain and diarrhea.  Endocrine: Negative for polyphagia.  Genitourinary:  Negative for dysuria.  Musculoskeletal:  Positive for myalgias.  Skin:  Negative for rash.  Neurological:  Positive for headaches. Negative for syncope.  Psychiatric/Behavioral:  Negative for confusion.    Physical Exam Updated Vital Signs BP 121/73   Pulse 88   Temp 98.8 F (37.1 C) (Oral)   Resp 18   Ht 5\' 8"  (1.727 m)   Wt 68 kg   LMP 01/17/2021 (Approximate)   SpO2 99%   BMI 22.81 kg/m   Physical Exam Vitals and nursing note reviewed.  Constitutional:      General: She is not in acute distress.  Comments: Pleasant well-appearing 20 year old.  In no acute distress.  Sitting comfortably in bed.  Able answer questions appropriately follow commands. No increased work of breathing. Speaking in full sentences.   HENT:     Head: Normocephalic and atraumatic.     Nose: Nose normal.     Mouth/Throat:     Mouth: Mucous membranes are moist.  Eyes:     General: No scleral icterus. Cardiovascular:     Rate and Rhythm: Normal rate and regular rhythm.     Pulses: Normal pulses.     Heart sounds: Normal heart sounds.  Pulmonary:     Effort: Pulmonary effort is normal. No  respiratory distress.     Breath sounds: No wheezing.     Comments: No tachypnea, speaking full sentences, lungs clear to auscultation all fields Abdominal:     Palpations: Abdomen is soft.     Tenderness: There is no abdominal tenderness. There is no guarding or rebound.  Musculoskeletal:     Cervical back: Normal range of motion.     Right lower leg: No edema.     Left lower leg: No edema.  Skin:    General: Skin is warm and dry.     Capillary Refill: Capillary refill takes less than 2 seconds.  Neurological:     Mental Status: She is alert. Mental status is at baseline.  Psychiatric:        Mood and Affect: Mood normal.        Behavior: Behavior normal.    ED Results / Procedures / Treatments   Labs (all labs ordered are listed, but only abnormal results are displayed) Labs Reviewed  RESP PANEL BY RT-PCR (FLU A&B, COVID) ARPGX2 - Abnormal; Notable for the following components:      Result Value   SARS Coronavirus 2 by RT PCR POSITIVE (*)    All other components within normal limits  CBC WITH DIFFERENTIAL/PLATELET - Abnormal; Notable for the following components:   Platelets 148 (*)    Lymphs Abs 0.5 (*)    All other components within normal limits  COMPREHENSIVE METABOLIC PANEL - Abnormal; Notable for the following components:   Sodium 134 (*)    Potassium 3.0 (*)    All other components within normal limits  URINALYSIS, ROUTINE W REFLEX MICROSCOPIC - Abnormal; Notable for the following components:   APPearance HAZY (*)    Hgb urine dipstick LARGE (*)    Ketones, ur 5 (*)    All other components within normal limits  I-STAT BETA HCG BLOOD, ED ([facility], [facility], [facility] ONLY)    EKG None  Radiology No results found.  Procedures Procedures   Medications Ordered in ED Medications  acetaminophen (TYLENOL) tablet 650 mg (650 mg Oral Given 01/24/21 1415)  potassium chloride SA (KLOR-CON) CR tablet 40 mEq (40 mEq Oral Given 01/24/21 1725)    ED Course  I have reviewed the  triage vital signs and the nursing notes.  Pertinent labs & imaging results that were available during my care of the patient were reviewed by me and considered in my medical decision making (see chart for details).  Clinical Course as of 01/24/21 2350  Fri Jan 24, 2021  2347 SARS Coronavirus 2 by RT PCR(!): POSITIVE [WF]  2347 Potassium(!): 3.0 [WF]    Clinical Course User Index [WF] Tedd Sias, PA   MDM Rules/Calculators/A&P  Patient is 19 year old female presented to ER today with complaints of myalgias fatigue cough congestion has had 2 episodes of nonbloody nonbilious emesis and has retched several times today as well.  Physical exam is unremarkable patient is well-appearing tolerating p.o. and much improved after Tylenol  No longer feels nauseous  Vital signs within normal limits Tylenol has resolved fever.  Abdomen is soft nontender  Lungs are clear to auscultation all fields  CMP unremarkable apart from mild hypokalemia which was repleted here patient is positive for COVID.  I-STAT hCG negative for pregnancy.  Urinalysis unremarkable apart from hemoglobin consistent patient just recently started her menstrual cycle.  CBC without leukocytosis or anemia.  She does not appear significantly dehydrated she is tolerating p.o. will recommend p.o. fluids discharged home with Zofran, Tylenol and ibuprofen at home.  Return precautions given   Asja Frommer was evaluated in Emergency Department on 01/24/2021 for the symptoms described in the history of present illness. She was evaluated in the context of the global COVID-19 pandemic, which necessitated consideration that the patient might be at risk for infection with the SARS-CoV-2 virus that causes COVID-19. Institutional protocols and algorithms that pertain to the evaluation of patients at risk for COVID-19 are in a state of rapid change based on information released by regulatory bodies including the  CDC and federal and state organizations. These policies and algorithms were followed during the patient's care in the ED.   Final Clinical Impression(s) / ED Diagnoses Final diagnoses:  COVID-19  Nausea and vomiting, unspecified vomiting type    Rx / DC Orders ED Discharge Orders          Ordered    ondansetron (ZOFRAN-ODT) 4 MG disintegrating tablet  Every 8 hours PRN        01/24/21 1700    ibuprofen (ADVIL) 600 MG tablet  Every 6 hours PRN        01/24/21 1700             Gailen Shelter, [place] 01/24/21 2352    Jacalyn Lefevre, MD 01/25/21 1457

## 2021-06-19 ENCOUNTER — Emergency Department (HOSPITAL_BASED_OUTPATIENT_CLINIC_OR_DEPARTMENT_OTHER)
Admission: EM | Admit: 2021-06-19 | Discharge: 2021-06-19 | Disposition: A | Discharge status: Home / Self Care | Payer: Medicaid Other | Attending: Emergency Medicine | Admitting: Emergency Medicine

## 2021-06-19 ENCOUNTER — Other Ambulatory Visit: Payer: Self-pay

## 2021-06-19 ENCOUNTER — Encounter (HOSPITAL_BASED_OUTPATIENT_CLINIC_OR_DEPARTMENT_OTHER): Payer: Self-pay | Admitting: Emergency Medicine

## 2021-06-19 ENCOUNTER — Other Ambulatory Visit (HOSPITAL_BASED_OUTPATIENT_CLINIC_OR_DEPARTMENT_OTHER): Payer: Self-pay

## 2021-06-19 DIAGNOSIS — R112 Nausea with vomiting, unspecified: Secondary | ICD-10-CM

## 2021-06-19 DIAGNOSIS — R001 Bradycardia, unspecified: Secondary | ICD-10-CM | POA: Diagnosis not present

## 2021-06-19 DIAGNOSIS — K226 Gastro-esophageal laceration-hemorrhage syndrome: Secondary | ICD-10-CM | POA: Insufficient documentation

## 2021-06-19 DIAGNOSIS — K920 Hematemesis: Secondary | ICD-10-CM | POA: Diagnosis present

## 2021-06-19 LAB — COMPREHENSIVE METABOLIC PANEL
ALT: 8 U/L (ref 0–44)
AST: 14 U/L — ABNORMAL LOW (ref 15–41)
Albumin: 4.6 g/dL (ref 3.5–5.0)
Alkaline Phosphatase: 54 U/L (ref 38–126)
Anion gap: 12 (ref 5–15)
BUN: 13 mg/dL (ref 6–20)
CO2: 22 mmol/L (ref 22–32)
Calcium: 10 mg/dL (ref 8.9–10.3)
Chloride: 104 mmol/L (ref 98–111)
Creatinine, Ser: 0.98 mg/dL (ref 0.44–1.00)
GFR, Estimated: >60 mL/min (ref >60)
Glucose, Bld: 88 mg/dL (ref 70–99)
Potassium: 3.6 mmol/L (ref 3.5–5.1)
Sodium: 138 mmol/L (ref 135–145)
Total Bilirubin: 0.6 mg/dL (ref 0.3–1.2)
Total Protein: 7.7 g/dL (ref 6.5–8.1)

## 2021-06-19 LAB — CBC WITH DIFFERENTIAL/PLATELET
Abs Immature Granulocytes: 0.02 10*3/uL (ref 0.00–0.07)
Basophils Absolute: 0 10*3/uL (ref 0.0–0.1)
Basophils Relative: 0 %
Eosinophils Absolute: 0 10*3/uL (ref 0.0–0.5)
Eosinophils Relative: 0 %
HCT: 42.7 % (ref 36.0–46.0)
Hemoglobin: 14.4 g/dL (ref 12.0–15.0)
Immature Granulocytes: 0 %
Lymphocytes Relative: 17 %
Lymphs Abs: 1.5 10*3/uL (ref 0.7–4.0)
MCH: 31.8 pg (ref 26.0–34.0)
MCHC: 33.7 g/dL (ref 30.0–36.0)
MCV: 94.3 fL (ref 80.0–100.0)
Monocytes Absolute: 0.6 10*3/uL (ref 0.1–1.0)
Monocytes Relative: 7 %
Neutro Abs: 6.9 10*3/uL (ref 1.7–7.7)
Neutrophils Relative %: 76 %
Platelets: 215 10*3/uL (ref 150–400)
RBC: 4.53 MIL/uL (ref 3.87–5.11)
RDW: 12.7 % (ref 11.5–15.5)
WBC: 9 10*3/uL (ref 4.0–10.5)
nRBC: 0 % (ref 0.0–0.2)

## 2021-06-19 LAB — LIPASE, BLOOD: Lipase: 17 U/L (ref 11–51)

## 2021-06-19 MED ORDER — LACTATED RINGERS IV BOLUS
1000.0000 mL | Freq: Once | INTRAVENOUS | Status: AC
  Administered 2021-06-19: 1000 mL via INTRAVENOUS

## 2021-06-19 MED ORDER — ONDANSETRON HCL 4 MG PO TABS
4.0000 mg | ORAL_TABLET | Freq: Four times a day (QID) | ORAL | 0 refills | Status: AC
  Filled 2021-06-19: qty 12, 3d supply, fill #0

## 2021-06-19 MED ORDER — ONDANSETRON HCL 4 MG/2ML IJ SOLN
4.0000 mg | Freq: Once | INTRAMUSCULAR | Status: AC
  Administered 2021-06-19: 4 mg via INTRAVENOUS
  Filled 2021-06-19: qty 2

## 2021-06-19 NOTE — Discharge Instructions (Signed)
All the labs look normal today.  You most likely have some small tears of your esophagus from all the vomiting.  Stick with a very bland diet today crackers, juice and you were given a prescription for some nausea medicine.  If the vomiting returns and it is not under control or you start having a fever or severe abdominal pain return to the emergency room. ?

## 2021-06-19 NOTE — ED Notes (Signed)
Patient given ginger ale. 

## 2021-06-19 NOTE — ED Provider Notes (Signed)
?[facility] EMERGENCY DEPT ?Provider Note ? ? ?CSN: [id number] ?Arrival date & time: 06/19/21  1033 ? ?  ? ?History ? ?Chief Complaint  ?Patient presents with  ? Hematemesis  ? ? ?Lisa Carney is a 21 y.o. female. ? ?Patient is a 21 year old female with no significant medical history except for bad allergies and significant premenstrual symptoms monthly who is presenting today with complaint of persistent vomiting and blood in her emesis since 2 AM this morning.  Patient reports that yesterday she started having lower abdominal cramps typical of her monthly menses and she then started vomiting.  Initially her emesis was yellow and green.  She reports she vomited more than 10 times and around 2 AM this morning she started noticing bright red blood in her emesis.  She has not had any further vomiting for the last few hours but has not tried to eat or drink anything.  She has had normal bowel movements and denies any urinary symptoms.  She has not had any fever.  The only pain she is having currently is in her lower abdomen.  She denies any upper abdominal tenderness or shortness of breath.  She has not had any nosebleeds in the last 24 hours that would cause the pain.  Her mother however reports she has a very forceful vomit to her.  She has never seen anybody about her premenstrual symptoms or tried treatment in the past. ? ? ? ?  ? ?Home Medications ?Prior to Admission medications   ?Medication Sig Start Date End Date Taking? Authorizing Provider  ?ondansetron (ZOFRAN) 4 MG tablet Take 1 tablet (4 mg total) by mouth every 6 (six) hours. 06/19/21  Yes Gwyneth Sprout, MD  ?ibuprofen (ADVIL) 600 MG tablet Take 1 tablet (600 mg total) by mouth every 6 (six) hours as needed. 01/24/21   Gailen Shelter, PA  ?ondansetron (ZOFRAN-ODT) 4 MG disintegrating tablet Take 1 tablet (4 mg total) by mouth every 8 (eight) hours as needed for nausea or vomiting. 01/24/21   Gailen Shelter, PA  ?   ? ?Allergies     ?Patient has no known allergies.   ? ?Review of Systems   ?Review of Systems ? ?Physical Exam ?Updated Vital Signs ?BP 132/79   Pulse 61   Temp 98.9 ?F (37.2 ?C)   Resp 16   Ht 5\' 8"  (1.727 m)   Wt 63.5 kg   SpO2 100%   BMI 21.29 kg/m?  ?Physical Exam ?Vitals and nursing note reviewed.  ?Constitutional:   ?   General: She is not in acute distress. ?   Appearance: She is well-developed.  ?HENT:  ?   Head: Normocephalic and atraumatic.  ?Eyes:  ?   Pupils: Pupils are equal, round, and reactive to light.  ?Cardiovascular:  ?   Rate and Rhythm: Regular rhythm. Bradycardia present.  ?   Heart sounds: Normal heart sounds. No murmur heard. ?  No friction rub.  ?Pulmonary:  ?   Effort: Pulmonary effort is normal.  ?   Breath sounds: Normal breath sounds. No wheezing or rales.  ?Abdominal:  ?   General: Bowel sounds are normal. There is no distension.  ?   Palpations: Abdomen is soft.  ?   Tenderness: There is abdominal tenderness in the right lower quadrant, suprapubic area and left lower quadrant. There is guarding. There is no rebound.  ?Musculoskeletal:     ?   General: No tenderness. Normal range of motion.  ?   Comments:  No edema  ?Skin: ?   General: Skin is warm and dry.  ?   Findings: No rash.  ?Neurological:  ?   Mental Status: She is alert and oriented to person, place, and time. Mental status is at baseline.  ?   Cranial Nerves: No cranial nerve deficit.  ?Psychiatric:     ?   Mood and Affect: Mood normal.     ?   Behavior: Behavior normal.  ? ? ?ED Results / Procedures / Treatments   ?Labs ?(all labs ordered are listed, but only abnormal results are displayed) ?Labs Reviewed  ?COMPREHENSIVE METABOLIC PANEL - Abnormal; Notable for the following components:  ?    Result Value  ? AST 14 (*)   ? All other components within normal limits  ?CBC WITH DIFFERENTIAL/PLATELET  ?LIPASE, BLOOD  ? ? ?EKG ?None ? ?Radiology ?No results found. ? ?Procedures ?Procedures  ? ? ?Medications Ordered in ED ?Medications   ?ondansetron (ZOFRAN) injection 4 mg (4 mg Intravenous Given 06/19/21 1121)  ?lactated ringers bolus 1,000 mL (1,000 mLs Intravenous New Bag/Given 06/19/21 1120)  ? ? ?ED Course/ Medical Decision Making/ A&P ?  ?                        ?Medical Decision Making ?Amount and/or Complexity of Data Reviewed ?Independent Historian: parent ?Labs: ordered. Decision-making details documented in ED Course. ? ?Risk ?Prescription drug management. ? ? ?Patient is a pleasant 21 year old female who is presenting today with persistent vomiting and hematemesis.  It is bright red blood in her emesis without dark blood or clots.  Suspect this is from Mallory-Weiss tear and low suspicion for Boerhaave's tear, upper GI bleeding from ulcers or varicose veins.  Patient also has not had any nosebleeds recently.  Her abdominal pain is in the lower quadrants and seems to be more typical of menses cramps.  Low suspicion for appendicitis, obstruction or diverticulitis at this time.  Patient has continued to have nausea and will not try eating or drinking anything.  We will give IV fluids, antiemetics and check labs. ? ?1:03 PM ?I independently interpreted patient's labs today and her CBC, CMP and lipase are all within normal limits.  After IV fluids and antiemetic on repeat evaluation patient is tolerating apple juice and appears to feel better.  Suspect Mallory-Weiss tears today.  Did discuss with she and her mother follow-up with OB/GYN for possible treatment of her premenstrual symptoms.  Patient does not appear to need admission today and is clear for discharge home.  She was given a prescription for antiemetics.  She was given return precautions. ? ? ? ? ? ? ? ?Final Clinical Impression(s) / ED Diagnoses ?Final diagnoses:  ?Nausea and vomiting, unspecified vomiting type  ?Mallory-Weiss tear  ? ? ?Rx / DC Orders ?ED Discharge Orders   ? ?      Ordered  ?  ondansetron (ZOFRAN) 4 MG tablet  Every 6 hours       ? 06/19/21 1303  ? ?  ?  ? ?   ? ? ?  ?Gwyneth Sprout, MD ?06/19/21 1303 ? ?

## 2021-06-19 NOTE — ED Triage Notes (Signed)
Pt arrives to ED with c/o hematemesis and hemoptysis. This started this morning at 2am. She reports coughing up blood at first then vomiting up blood x3. She reports abdominal pain in suprapubic area and reports she started her menstrual cycle yesterday.  ?

## 2021-07-04 ENCOUNTER — Emergency Department (HOSPITAL_BASED_OUTPATIENT_CLINIC_OR_DEPARTMENT_OTHER): Payer: Medicaid Other

## 2021-07-04 ENCOUNTER — Other Ambulatory Visit: Payer: Self-pay

## 2021-07-04 ENCOUNTER — Encounter (HOSPITAL_BASED_OUTPATIENT_CLINIC_OR_DEPARTMENT_OTHER): Payer: Self-pay | Admitting: *Deleted

## 2021-07-04 ENCOUNTER — Emergency Department (HOSPITAL_BASED_OUTPATIENT_CLINIC_OR_DEPARTMENT_OTHER)
Admission: EM | Admit: 2021-07-04 | Discharge: 2021-07-04 | Disposition: A | Discharge status: Home / Self Care | Payer: Medicaid Other | Attending: Emergency Medicine | Admitting: Emergency Medicine

## 2021-07-04 DIAGNOSIS — Y9367 Activity, basketball: Secondary | ICD-10-CM | POA: Insufficient documentation

## 2021-07-04 DIAGNOSIS — M25532 Pain in left wrist: Secondary | ICD-10-CM | POA: Diagnosis not present

## 2021-07-04 DIAGNOSIS — W03XXXA Other fall on same level due to collision with another person, initial encounter: Secondary | ICD-10-CM | POA: Diagnosis not present

## 2021-07-04 DIAGNOSIS — S63502A Unspecified sprain of left wrist, initial encounter: Secondary | ICD-10-CM

## 2021-07-04 NOTE — Discharge Instructions (Signed)
Wear Ace bandage for comfort and support.  Ice for 20 minutes every 2 hours while awake for the next 2 days.  Take ibuprofen 600 mg every 6 hours as needed for pain.  Follow-up with primary doctor if not improving in the next week. 

## 2021-07-04 NOTE — ED Provider Notes (Signed)
?  [facility] DEPT ?Provider Note ? ? ?CSN: [id number] ?Arrival date & time: 07/04/21  0132 ? ?  ? ?History ? ?Chief Complaint  ?Patient presents with  ? Wrist Pain  ? ? ?Lisa Carney is a 21 y.o. female. ? ?Patient is a 21 year old female with no significant past medical history.  She presents with complaints of left wrist pain.  She was playing basketball yesterday when she fell and another player fell on top of her wrist.  She describes pain with range of motion.  Pain alleviated with rest. ? ?The history is provided by the patient.  ? ?  ? ?Home Medications ?Prior to Admission medications   ?Medication Sig Start Date End Date Taking? Authorizing Provider  ?ibuprofen (ADVIL) 600 MG tablet Take 1 tablet (600 mg total) by mouth every 6 (six) hours as needed. 01/24/21   Gailen Shelter, PA  ?ondansetron (ZOFRAN) 4 MG tablet Take 1 tablet (4 mg total) by mouth every 6 (six) hours. 06/19/21   Gwyneth Sprout, MD  ?ondansetron (ZOFRAN-ODT) 4 MG disintegrating tablet Take 1 tablet (4 mg total) by mouth every 8 (eight) hours as needed for nausea or vomiting. 01/24/21   Gailen Shelter, PA  ?   ? ?Allergies    ?Patient has no known allergies.   ? ?Review of Systems   ?Review of Systems  ?All other systems reviewed and are negative. ? ?Physical Exam ?Updated Vital Signs ?BP 123/82 (BP Location: Left Arm)   Pulse 98   Temp 98.1 ?F (36.7 ?C) (Oral)   Resp 16   Wt 61.2 kg   LMP 06/19/2021   SpO2 100%   BMI 20.53 kg/m?  ?Physical Exam ?Vitals and nursing note reviewed.  ?Constitutional:   ?   Appearance: Normal appearance.  ?Pulmonary:  ?   Effort: Pulmonary effort is normal.  ?Musculoskeletal:  ?   Comments: The left wrist appears grossly unremarkable.  There is some tenderness to palpation over the distal wrist, but there is pain with range of motion.  Sensation intact and capillary refill is brisk.  ?Neurological:  ?   Mental Status: She is alert.  ? ? ?ED Results / Procedures / Treatments    ?Labs ?(all labs ordered are listed, but only abnormal results are displayed) ?Labs Reviewed - No data to display ? ?EKG ?None ? ?Radiology ?No results found. ? ?Procedures ?Procedures  ? ? ?Medications Ordered in ED ?Medications - No data to display ? ?ED Course/ Medical Decision Making/ A&P ? ?X-rays negative for fracture.  Patient to be discharged with an Ace bandage, ice, and follow-up as needed. ? ?Final Clinical Impression(s) / ED Diagnoses ?Final diagnoses:  ?None  ? ? ?Rx / DC Orders ?ED Discharge Orders   ? ? None  ? ?  ? ? ?  ?Geoffery Lyons, MD ?07/04/21 1660 ? ?

## 2021-07-04 NOTE — ED Notes (Signed)
Pt given coke and crackers.  Pt declines ice pack ?

## 2021-07-04 NOTE — ED Triage Notes (Signed)
Pt arrives to ED POV for left wrist pain.  Pt reports that she injured it when she fell playing basketball yesterday. ?

## 2021-07-14 ENCOUNTER — Ambulatory Visit (HOSPITAL_BASED_OUTPATIENT_CLINIC_OR_DEPARTMENT_OTHER): Payer: Medicaid Other | Admitting: Nurse Practitioner

## 2021-07-18 ENCOUNTER — Other Ambulatory Visit: Payer: Self-pay

## 2021-07-18 ENCOUNTER — Encounter (HOSPITAL_BASED_OUTPATIENT_CLINIC_OR_DEPARTMENT_OTHER): Payer: Self-pay

## 2021-07-18 ENCOUNTER — Emergency Department (HOSPITAL_BASED_OUTPATIENT_CLINIC_OR_DEPARTMENT_OTHER)
Admission: EM | Admit: 2021-07-18 | Discharge: 2021-07-18 | Disposition: A | Discharge status: Home / Self Care | Payer: Medicaid Other | Attending: Emergency Medicine | Admitting: Emergency Medicine

## 2021-07-18 DIAGNOSIS — Z5321 Procedure and treatment not carried out due to patient leaving prior to being seen by health care provider: Secondary | ICD-10-CM | POA: Diagnosis not present

## 2021-07-18 DIAGNOSIS — R059 Cough, unspecified: Secondary | ICD-10-CM | POA: Diagnosis present

## 2021-07-18 NOTE — ED Triage Notes (Signed)
POV, pt sts that she was throwing up for a couple days until this AM, yesterday had a couple coughing episodes where she coughed up "like period blood, red with clots". Sts that she was supposed to start back work today after being sick with the vomiting but called out and work said to come get work note. Sts that she still has stomach pain but denies any cough, sore throat at this time. Alert and oriented, ambulatory to triage.

## 2021-07-19 ENCOUNTER — Encounter (HOSPITAL_BASED_OUTPATIENT_CLINIC_OR_DEPARTMENT_OTHER): Payer: Self-pay | Admitting: Emergency Medicine

## 2021-07-19 ENCOUNTER — Emergency Department (HOSPITAL_BASED_OUTPATIENT_CLINIC_OR_DEPARTMENT_OTHER): Payer: Medicaid Other | Admitting: Radiology

## 2021-07-19 ENCOUNTER — Emergency Department (HOSPITAL_BASED_OUTPATIENT_CLINIC_OR_DEPARTMENT_OTHER)
Admission: EM | Admit: 2021-07-19 | Discharge: 2021-07-19 | Disposition: A | Discharge status: Home / Self Care | Payer: Medicaid Other | Attending: Emergency Medicine | Admitting: Emergency Medicine

## 2021-07-19 DIAGNOSIS — Z20822 Contact with and (suspected) exposure to covid-19: Secondary | ICD-10-CM | POA: Diagnosis not present

## 2021-07-19 DIAGNOSIS — R059 Cough, unspecified: Secondary | ICD-10-CM

## 2021-07-19 DIAGNOSIS — B349 Viral infection, unspecified: Secondary | ICD-10-CM | POA: Diagnosis not present

## 2021-07-19 LAB — CBC WITH DIFFERENTIAL/PLATELET
Abs Immature Granulocytes: 0.01 10*3/uL (ref 0.00–0.07)
Basophils Absolute: 0 10*3/uL (ref 0.0–0.1)
Basophils Relative: 0 %
Eosinophils Absolute: 0.2 10*3/uL (ref 0.0–0.5)
Eosinophils Relative: 4 %
HCT: 36.1 % (ref 36.0–46.0)
Hemoglobin: 12 g/dL (ref 12.0–15.0)
Immature Granulocytes: 0 %
Lymphocytes Relative: 34 %
Lymphs Abs: 2.1 10*3/uL (ref 0.7–4.0)
MCH: 31.6 pg (ref 26.0–34.0)
MCHC: 33.2 g/dL (ref 30.0–36.0)
MCV: 95 fL (ref 80.0–100.0)
Monocytes Absolute: 0.4 10*3/uL (ref 0.1–1.0)
Monocytes Relative: 7 %
Neutro Abs: 3.5 10*3/uL (ref 1.7–7.7)
Neutrophils Relative %: 55 %
Platelets: 174 10*3/uL (ref 150–400)
RBC: 3.8 MIL/uL — ABNORMAL LOW (ref 3.87–5.11)
RDW: 13.2 % (ref 11.5–15.5)
WBC: 6.3 10*3/uL (ref 4.0–10.5)
nRBC: 0 % (ref 0.0–0.2)

## 2021-07-19 LAB — COMPREHENSIVE METABOLIC PANEL
ALT: 7 U/L (ref 0–44)
AST: 11 U/L — ABNORMAL LOW (ref 15–41)
Albumin: 3.8 g/dL (ref 3.5–5.0)
Alkaline Phosphatase: 54 U/L (ref 38–126)
Anion gap: 10 (ref 5–15)
BUN: 17 mg/dL (ref 6–20)
CO2: 22 mmol/L (ref 22–32)
Calcium: 8.8 mg/dL — ABNORMAL LOW (ref 8.9–10.3)
Chloride: 110 mmol/L (ref 98–111)
Creatinine, Ser: 0.91 mg/dL (ref 0.44–1.00)
GFR, Estimated: >60 mL/min (ref >60)
Glucose, Bld: 108 mg/dL — ABNORMAL HIGH (ref 70–99)
Potassium: 3.6 mmol/L (ref 3.5–5.1)
Sodium: 142 mmol/L (ref 135–145)
Total Bilirubin: 0.3 mg/dL (ref 0.3–1.2)
Total Protein: 6.5 g/dL (ref 6.5–8.1)

## 2021-07-19 LAB — RESP PANEL BY RT-PCR (FLU A&B, COVID) ARPGX2
Influenza A by PCR: NEGATIVE
Influenza B by PCR: NEGATIVE
SARS Coronavirus 2 by RT PCR: NEGATIVE

## 2021-07-19 LAB — PREGNANCY, URINE: Preg Test, Ur: NEGATIVE

## 2021-07-19 NOTE — ED Provider Notes (Signed)
[facility] EMERGENCY DEPT Provider Note   CSN: [id number] Arrival date & time: 07/19/21  0700     History  Chief Complaint  Patient presents with   Cough    Lisa Carney is a 21 y.o. female.  Presented to the emergency department due to concern for cough, vomiting.  Patient reports that she first started feeling unwell on Thursday, had some nausea followed by a couple episodes of vomiting.  States that there was a small streak of blood in her vomit.  No large clots of blood.  Yesterday she did not have any more vomiting but did have some coughing spells, states that at one point she thought she saw a small streak of blood in her cough.  This morning she actually feels markedly improved.  Denies any ongoing cough or nausea.  No abdominal pain or fever or chills.  No episodes of syncope.  She just wanted to make sure that she was okay after having those 2 days of not feeling well.  She denies any major medical problems.  Not on blood thinners.  Later endorsed being late for menstrual cycle.  HPI     Home Medications Prior to Admission medications   Medication Sig Start Date End Date Taking? Authorizing Provider  ibuprofen (ADVIL) 600 MG tablet Take 1 tablet (600 mg total) by mouth every 6 (six) hours as needed. 01/24/21   Fondaw, Rodrigo Ran, PA  ondansetron (ZOFRAN) 4 MG tablet Take 1 tablet (4 mg total) by mouth every 6 (six) hours. 06/19/21   Gwyneth Sprout, MD  ondansetron (ZOFRAN-ODT) 4 MG disintegrating tablet Take 1 tablet (4 mg total) by mouth every 8 (eight) hours as needed for nausea or vomiting. 01/24/21   Gailen Shelter, PA      Allergies    Patient has no known allergies.    Review of Systems   Review of Systems  Constitutional:  Negative for chills and fever.  HENT:  Negative for ear pain and sore throat.   Eyes:  Negative for pain and visual disturbance.  Respiratory:  Positive for cough. Negative for shortness of breath.   Cardiovascular:  Negative  for chest pain and palpitations.  Gastrointestinal:  Positive for vomiting. Negative for abdominal pain.  Genitourinary:  Negative for dysuria and hematuria.  Musculoskeletal:  Negative for arthralgias and back pain.  Skin:  Negative for color change and rash.  Neurological:  Negative for seizures and syncope.  All other systems reviewed and are negative.  Physical Exam Updated Vital Signs BP 111/70   Pulse 81   Temp 98.7 F (37.1 C) (Oral)   LMP 06/19/2021 Comment: late  SpO2 96%  Physical Exam Vitals and nursing note reviewed.  Constitutional:      General: She is not in acute distress.    Appearance: She is well-developed.  HENT:     Head: Normocephalic and atraumatic.  Eyes:     Conjunctiva/sclera: Conjunctivae normal.  Cardiovascular:     Rate and Rhythm: Normal rate and regular rhythm.     Heart sounds: No murmur heard. Pulmonary:     Effort: Pulmonary effort is normal. No respiratory distress.     Breath sounds: Normal breath sounds.  Abdominal:     Palpations: Abdomen is soft.     Tenderness: There is no abdominal tenderness.  Musculoskeletal:        General: No swelling.     Cervical back: Neck supple.  Skin:    General: Skin is warm and dry.  Capillary Refill: Capillary refill takes less than 2 seconds.  Neurological:     Mental Status: She is alert.  Psychiatric:        Mood and Affect: Mood normal.    ED Results / Procedures / Treatments   Labs (all labs ordered are listed, but only abnormal results are displayed) Labs Reviewed  CBC WITH DIFFERENTIAL/PLATELET - Abnormal; Notable for the following components:      Result Value   RBC 3.80 (*)    All other components within normal limits  COMPREHENSIVE METABOLIC PANEL - Abnormal; Notable for the following components:   Glucose, Bld 108 (*)    Calcium 8.8 (*)    AST 11 (*)    All other components within normal limits  RESP PANEL BY RT-PCR (FLU A&B, COVID) ARPGX2  PREGNANCY, URINE     EKG None  Radiology DG Chest Port 1 View  Result Date: 07/19/2021 CLINICAL DATA:  21 year old female with history of hemoptysis. EXAM: PORTABLE CHEST 1 VIEW COMPARISON:  Chest x-ray 04/09/2019. FINDINGS: Lung volumes are normal. No consolidative airspace disease. No pleural effusions. No pneumothorax. No pulmonary nodule or mass noted. Pulmonary vasculature and the cardiomediastinal silhouette are within normal limits. IMPRESSION: No radiographic evidence of acute cardiopulmonary disease. Electronically Signed   By: Trudie Reed M.D.   On: 07/19/2021 08:23    Procedures Procedures    Medications Ordered in ED Medications - No data to display  ED Course/ Medical Decision Making/ A&P                           Medical Decision Making Amount and/or Complexity of Data Reviewed Labs: ordered. Radiology: ordered.   47-year-old otherwise healthy lady presents to ER due to concern for cough, nausea and vomiting.  She had endorsed episode of streak of blood in both vomit and cough.  This morning however her symptoms had already resolved prior to arrival in ER.  She appears well in no distress, her vitals are all within normal limits.  Pregnancy test negative, COVID/flu negative.  I independently reviewed and interpreted the CXR.  No focal infiltrate to suggest pneumonia.  No anemia, no electrolyte derangement.  Given reassuring work-up today and no ongoing symptoms, will discharge patient home.  Advise follow-up with PCP.    After the discussed management above, the patient was determined to be safe for discharge.  The patient was in agreement with this plan and all questions regarding their care were answered.  ED return precautions were discussed and the patient will return to the ED with any significant worsening of condition.         Final Clinical Impression(s) / ED Diagnoses Final diagnoses:  Cough, unspecified type  Acute viral syndrome    Rx / DC Orders ED Discharge  Orders     None         Milagros Loll, MD 07/19/21 0900

## 2021-07-19 NOTE — ED Notes (Signed)
Pt also states late for menses. Request  preg test

## 2021-07-19 NOTE — Discharge Instructions (Signed)
Follow-up with your primary care doctor to discuss your symptoms further.  Come back here if you have any recurrent vomiting blood, coughing up blood, chest pain, difficulty breathing or other new concerning symptom.

## 2021-07-19 NOTE — ED Notes (Signed)
Dc instructions reviewed with patient. Patient voiced understanding. Dc with belongings.  °

## 2021-07-19 NOTE — ED Triage Notes (Signed)
Pt states had been sick ,throwing up , congested for 2 days last week, got better and then noticed some blood in sputum last Saturday, here to get checked out.

## 2021-09-25 ENCOUNTER — Encounter (HOSPITAL_BASED_OUTPATIENT_CLINIC_OR_DEPARTMENT_OTHER): Payer: Self-pay | Admitting: Nurse Practitioner

## 2021-12-14 ENCOUNTER — Other Ambulatory Visit: Payer: Self-pay

## 2021-12-14 ENCOUNTER — Emergency Department (HOSPITAL_BASED_OUTPATIENT_CLINIC_OR_DEPARTMENT_OTHER)
Admission: EM | Admit: 2021-12-14 | Discharge: 2021-12-14 | Disposition: A | Discharge status: Home / Self Care | Payer: Medicaid Other | Attending: Emergency Medicine | Admitting: Emergency Medicine

## 2021-12-14 DIAGNOSIS — Z20822 Contact with and (suspected) exposure to covid-19: Secondary | ICD-10-CM | POA: Insufficient documentation

## 2021-12-14 DIAGNOSIS — R509 Fever, unspecified: Secondary | ICD-10-CM | POA: Diagnosis present

## 2021-12-14 DIAGNOSIS — B349 Viral infection, unspecified: Secondary | ICD-10-CM | POA: Diagnosis not present

## 2021-12-14 LAB — SARS CORONAVIRUS 2 BY RT PCR: SARS Coronavirus 2 by RT PCR: NEGATIVE

## 2021-12-14 NOTE — Discharge Instructions (Addendum)
Manic strength Tylenol review fevers and body aches.  If he started having cough and congestion recommend over-the-counter cold and flu medicines.  Work note provided.  Return for any new or worse symptoms or follow-up with your doctor.  COVID testing was negative.

## 2021-12-14 NOTE — ED Triage Notes (Signed)
Starting last night patient reports fever of 100.19F, came down with tylenol. Also reports chills, intermittent HA. Denies weakness/fatigue/N/V, body aches.

## 2021-12-14 NOTE — ED Provider Notes (Signed)
[facility] EMERGENCY DEPARTMENT Provider Note   CSN: [id number] Arrival date & time: 12/14/21  2004     History  Chief Complaint  Patient presents with   Fever    Lisa Carney is a 21 y.o. female.  Patient with onset of fever very slight sore throat some chills intermittent headache states does have some body aches now.  No nausea vomiting or diarrhea no abdominal pain no dysuria no chest pain.  No cough or congestion.  Temp here 98.3.  Reports fever to 100.8.  Oxygen saturations 100%.  Onset of symptoms was today.    Past medical history noncontributory past surgical history noncontributory patient is never used tobacco products.       Home Medications Prior to Admission medications   Medication Sig Start Date End Date Taking? Authorizing Provider  ibuprofen (ADVIL) 600 MG tablet Take 1 tablet (600 mg total) by mouth every 6 (six) hours as needed. 01/24/21   Fondaw, Kathleene Hazel, PA  ondansetron (ZOFRAN) 4 MG tablet Take 1 tablet (4 mg total) by mouth every 6 (six) hours. 06/19/21   Blanchie Dessert, MD  ondansetron (ZOFRAN-ODT) 4 MG disintegrating tablet Take 1 tablet (4 mg total) by mouth every 8 (eight) hours as needed for nausea or vomiting. 01/24/21   Tedd Sias, PA      Allergies    Patient has no known allergies.    Review of Systems   Review of Systems  Constitutional:  Positive for chills and fever.  HENT:  Positive for sore throat. Negative for ear pain.   Eyes:  Negative for pain and visual disturbance.  Respiratory:  Negative for cough and shortness of breath.   Cardiovascular:  Negative for chest pain and palpitations.  Gastrointestinal:  Negative for abdominal pain, diarrhea, nausea and vomiting.  Genitourinary:  Negative for dysuria and hematuria.  Musculoskeletal:  Positive for myalgias. Negative for arthralgias and back pain.  Skin:  Negative for color change and rash.  Neurological:  Negative for seizures and syncope.  All other  systems reviewed and are negative.   Physical Exam Updated Vital Signs BP 126/73 (BP Location: Right Arm)   Pulse 99   Temp 98.3 F (36.8 C) (Oral)   Resp 18   Ht 1.753 m (5\' 9" )   Wt 59 kg   SpO2 100%   BMI 19.20 kg/m  Physical Exam Vitals and nursing note reviewed.  Constitutional:      General: She is not in acute distress.    Appearance: Normal appearance. She is well-developed.  HENT:     Head: Normocephalic and atraumatic.     Mouth/Throat:     Mouth: Mucous membranes are moist.     Pharynx: Oropharynx is clear. No oropharyngeal exudate or posterior oropharyngeal erythema.  Eyes:     Extraocular Movements: Extraocular movements intact.     Conjunctiva/sclera: Conjunctivae normal.     Pupils: Pupils are equal, round, and reactive to light.  Cardiovascular:     Rate and Rhythm: Normal rate and regular rhythm.     Heart sounds: No murmur heard. Pulmonary:     Effort: Pulmonary effort is normal. No respiratory distress.     Breath sounds: Normal breath sounds. No wheezing, rhonchi or rales.  Chest:     Chest wall: No tenderness.  Abdominal:     Palpations: Abdomen is soft.     Tenderness: There is no abdominal tenderness.  Musculoskeletal:        General: No swelling.  Cervical back: Normal range of motion and neck supple. No rigidity.     Right lower leg: No edema.     Left lower leg: No edema.  Skin:    General: Skin is warm and dry.     Capillary Refill: Capillary refill takes less than 2 seconds.  Neurological:     General: No focal deficit present.     Mental Status: She is alert and oriented to person, place, and time.     Cranial Nerves: No cranial nerve deficit.     Sensory: No sensory deficit.     Motor: No weakness.  Psychiatric:        Mood and Affect: Mood normal.     ED Results / Procedures / Treatments   Labs (all labs ordered are listed, but only abnormal results are displayed) Labs Reviewed  SARS CORONAVIRUS 2 BY RT PCR     EKG None  Radiology No results found.  Procedures Procedures    Medications Ordered in ED Medications - No data to display  ED Course/ Medical Decision Making/ A&P                           Medical Decision Making  Patient's vital signs very normal here.  Throat without any significant erythema swelling or exudate.  Uvula midline.  Chest clear to auscultation bilaterally.  Abdomen soft and nontender.  Onset of symptoms: Just today.  Suggestive of viral illness.  COVID testing negative.  Patient nontoxic no acute distress.  Work note provided. Final Clinical Impression(s) / ED Diagnoses Final diagnoses:  Viral illness    Rx / DC Orders ED Discharge Orders     None         Vanetta Mulders, MD 12/14/21 2253

## 2022-11-08 ENCOUNTER — Other Ambulatory Visit: Payer: Self-pay

## 2022-11-08 ENCOUNTER — Emergency Department: Payer: Medicaid Other

## 2022-11-08 ENCOUNTER — Encounter: Payer: Self-pay | Admitting: Emergency Medicine

## 2022-11-08 ENCOUNTER — Emergency Department
Admission: EM | Admit: 2022-11-08 | Discharge: 2022-11-08 | Disposition: A | Discharge status: Home / Self Care | Payer: Medicaid Other | Attending: Emergency Medicine | Admitting: Emergency Medicine

## 2022-11-08 DIAGNOSIS — X509XXA Other and unspecified overexertion or strenuous movements or postures, initial encounter: Secondary | ICD-10-CM | POA: Insufficient documentation

## 2022-11-08 DIAGNOSIS — M25572 Pain in left ankle and joints of left foot: Secondary | ICD-10-CM | POA: Insufficient documentation

## 2022-11-08 DIAGNOSIS — Y9351 Activity, roller skating (inline) and skateboarding: Secondary | ICD-10-CM | POA: Insufficient documentation

## 2022-11-08 NOTE — ED Triage Notes (Signed)
Pt states she was at work roller-blading and twisted her left ankle. Pt denies hitting her head or loss of consciousness. Pt states this happened within the last hour.

## 2022-11-08 NOTE — ED Provider Triage Note (Signed)
Emergency Medicine Provider Triage Evaluation Note  Lisa Carney , a 22 y.o. female  was evaluated in triage.  Pt complains of left ankle pain. Reports she was skating at work and did a trip and twisted her ankle just PTA. No other injury. Able to ambulate with limp  Review of Systems  Positive: Ankle pain  Negative: paresthesias  Physical Exam  There were no vitals taken for this visit. Gen:   Awake, no distress   Resp:  Normal effort  MSK:   Mild tenderness to lateral malleolus, normal pulse, no deformity, able to range ankle Other:    Medical Decision Making  Medically screening exam initiated at 12:00 PM.  Appropriate orders placed.  Lisa Carney was informed that the remainder of the evaluation will be completed by another provider, this initial triage assessment does not replace that evaluation, and the importance of remaining in the ED until their evaluation is complete.     Lisa Hoehn, PA-C 11/08/22 1210

## 2022-11-08 NOTE — ED Provider Notes (Signed)
[facility] Provider Note    Event Date/Time   First MD Initiated Contact with Patient 11/08/22 1234     (approximate)   History   Ankle Pain (Left)   HPI  Lisa Carney is a 22 y.o. female  who presents today for evaluation of left sided ankle pain. Reports that she was skating and did a trick and twisted her ankle. Has been able to bear weight with pain. No paresthesias. No weakness. No other pain or injuries sustained. No HS or LOC.      Physical Exam   Triage Vital Signs: ED Triage Vitals [11/08/22 1203]  Encounter Vitals Group     BP 114/76     Systolic BP Percentile      Diastolic BP Percentile      Pulse Rate 61     Resp 18     Temp 97.7 F (36.5 C)     Temp src      SpO2 100 %     Weight 143 lb (64.9 kg)     Height 5\' 9"  (1.753 m)     Head Circumference      Peak Flow      Pain Score 8     Pain Loc      Pain Education      Exclude from Growth Chart     Most recent vital signs: Vitals:   11/08/22 1203  BP: 114/76  Pulse: 61  Resp: 18  Temp: 97.7 F (36.5 C)  SpO2: 100%    Physical Exam Vitals and nursing note reviewed.  Constitutional:      General: Awake and alert. No acute distress.    Appearance: Normal appearance. The patient is normal weight.  HENT:     Head: Normocephalic and atraumatic.     Mouth: Mucous membranes are moist.  Eyes:     General: PERRL. Normal EOMs        Right eye: No discharge.        Left eye: No discharge.     Conjunctiva/sclera: Conjunctivae normal.  Cardiovascular:     Rate and Rhythm: Normal rate and regular rhythm.     Pulses: Normal pulses.     Heart sounds: Normal heart sounds Pulmonary:     Effort: Pulmonary effort is normal. No respiratory distress.     Breath sounds: Normal breath sounds.  Abdominal:     Abdomen is soft. There is no abdominal tenderness. No rebound or guarding. No distention. Musculoskeletal:        General: No swelling. Normal range of motion.      Cervical back: Normal range of motion and neck supple.  Left ankle: Tenderness without swelling over the anterior talofibular ligament, mild lateral malleolar tenderness, no medial malleolar tenderness or proximal fifth metacarpal tenderness. No proximal fibular tenderness. 2+ pedal pulses with brisk capillary refill. Intact distal sensation and strength with normal ROM. Able to plantar flex and dorsiflex against resistance. Able to invert and evert against resistance. Negative  dorsiflexion external rotation test. Negative squeeze test. Negative Thompson test Skin:    General: Skin is warm and dry.     Capillary Refill: Capillary refill takes less than 2 seconds.     Findings: No rash.  Neurological:     Mental Status: The patient is awake and alert.      ED Results / Procedures / Treatments   Labs (all labs ordered are listed, but only abnormal results are displayed) Labs Reviewed - No  data to display   EKG     RADIOLOGY I independently reviewed and interpreted imaging and agree with radiologists findings.     PROCEDURES:  Critical Care performed:   Procedures   MEDICATIONS ORDERED IN ED: Medications - No data to display   IMPRESSION / MDM / ASSESSMENT AND PLAN / ED COURSE  I reviewed the triage vital signs and the nursing notes.   Differential diagnosis includes, but is not limited to, fracture, sprain, contusion. Patient has swelling and tenderness to palpation over lateral malleolus and ATFL.  Therefore per [place] ankle rules x-ray was obtained.  There is no evidence of fracture on x-ray.  There is no tenderness to proximal fibula that would be concerning for occult fracture.  There is no knee pain or swelling and no ligamental laxity, do not suspect knee injury.  There is no proximal fifth metatarsal tenderness concerning for Jones fracture.  Negative squeeze test so do not suspect high ankle sprain.  Negative Thompson test, do not suspect Achilles tendon rupture.  Patient is able to bear weight with pain.  Patient was given ace wrap. We discussed Rice and outpatient follow-up.  Patient was treated symptomatically in the emergency department with improvement of symptoms.  We discussed no sports until ankle heals.  Patient understands and agrees with plan. Discharged in stable condition. Able to ambulate.   Patient's presentation is most consistent with acute complicated illness / injury requiring diagnostic workup.   FINAL CLINICAL IMPRESSION(S) / ED DIAGNOSES   Final diagnoses:  Acute left ankle pain     Rx / DC Orders   ED Discharge Orders     None        Note:  This document was prepared using Dragon voice recognition software and may include unintentional dictation errors.   Jackelyn Hoehn, PA-C 11/08/22 1430    Janith Lima, MD 11/08/22 [phone]

## 2022-11-08 NOTE — Discharge Instructions (Signed)
You were seen in the Emergency Department for ankle sprain. Your x-ray showed no fractures or dislocations. If you continue to have pain you might need to seek more medical attention and follow up with your primary care doctor or an orthopedist as needed.Your x-ray did not show any evidence of fracture; it is likely that you sprained your ankle. -- Use ice for comfort for 30 minutes 4 or 5 times a day to help reduce pain and swelling. -- Keep your ankle elevated to reduce swelling. -- You may bear weight as tolerated. -- Take Tylenol or Ibuprofen for pain. -- You can use an ace wrap for comfort as well. -- Please follow up with your primary care doctor or orthopedics as needed.

## 2022-11-09 NOTE — Group Note (Deleted)

## 2023-02-25 ENCOUNTER — Emergency Department
Admission: EM | Admit: 2023-02-25 | Discharge: 2023-02-25 | Disposition: A | Discharge status: Home / Self Care | Payer: Medicaid Other | Attending: Emergency Medicine | Admitting: Emergency Medicine

## 2023-02-25 ENCOUNTER — Other Ambulatory Visit: Payer: Self-pay

## 2023-02-25 DIAGNOSIS — L309 Dermatitis, unspecified: Secondary | ICD-10-CM | POA: Diagnosis not present

## 2023-02-25 DIAGNOSIS — R21 Rash and other nonspecific skin eruption: Secondary | ICD-10-CM | POA: Diagnosis present

## 2023-02-25 LAB — CBC
HCT: 38.3 % (ref 36.0–46.0)
Hemoglobin: 13.3 g/dL (ref 12.0–15.0)
MCH: 32.8 pg (ref 26.0–34.0)
MCHC: 34.7 g/dL (ref 30.0–36.0)
MCV: 94.3 fL (ref 80.0–100.0)
Platelets: 228 10*3/uL (ref 150–400)
RBC: 4.06 MIL/uL (ref 3.87–5.11)
RDW: 12 % (ref 11.5–15.5)
WBC: 7.5 10*3/uL (ref 4.0–10.5)
nRBC: 0 % (ref 0.0–0.2)

## 2023-02-25 LAB — BASIC METABOLIC PANEL
Anion gap: 9 (ref 5–15)
BUN: 19 mg/dL (ref 6–20)
CO2: 23 mmol/L (ref 22–32)
Calcium: 9.1 mg/dL (ref 8.9–10.3)
Chloride: 106 mmol/L (ref 98–111)
Creatinine, Ser: 1.24 mg/dL — ABNORMAL HIGH (ref 0.44–1.00)
GFR, Estimated: >60 mL/min (ref >60)
Glucose, Bld: 89 mg/dL (ref 70–99)
Potassium: 4 mmol/L (ref 3.5–5.1)
Sodium: 138 mmol/L (ref 135–145)

## 2023-02-25 MED ORDER — HYDROCORTISONE 2.5 % EX OINT: TOPICAL_OINTMENT | Freq: Two times a day (BID) | CUTANEOUS | 0 refills | Status: AC

## 2023-02-25 NOTE — ED Provider Notes (Signed)
[facility] Provider Note    Event Date/Time   First MD Initiated Contact with Patient 02/25/23 2026     (approximate)   History   Back Pain   HPI  Lisa Carney is a 22 y.o. female with no significant past medical history and as listed in EMR presents to the emergency department for treatment and evaluation of rash and discoloration to her lower back that has been present for about a month.  Area occasionally itches.  No alleviating measures attempted.  Mom felt that it looked different today which prompted her visit to the emergency department..      Physical Exam   Triage Vital Signs: ED Triage Vitals [02/25/23 1813]  Encounter Vitals Group     BP 130/72     Systolic BP Percentile      Diastolic BP Percentile      Pulse Rate 90     Resp 16     Temp 98.9 F (37.2 C)     Temp Source Oral     SpO2 100 %     Weight      Height      Head Circumference      Peak Flow      Pain Score 7     Pain Loc      Pain Education      Exclude from Growth Chart     Most recent vital signs: Vitals:   02/25/23 1813  BP: 130/72  Pulse: 90  Resp: 16  Temp: 98.9 F (37.2 C)  SpO2: 100%    General: Awake, no distress.  CV:  Good peripheral perfusion.  Resp:  Normal effort.  Abd:  No distention.  Other:  Scattered, dark lesions without erythema or tenderness on lower back.   ED Results / Procedures / Treatments   Labs (all labs ordered are listed, but only abnormal results are displayed) Labs Reviewed  BASIC METABOLIC PANEL - Abnormal; Notable for the following components:      Result Value   Creatinine, Ser 1.24 (*)    All other components within normal limits  CBC     EKG  Not indicated.   RADIOLOGY  Image and radiology report reviewed and interpreted by me. Radiology report consistent with the same.  Not indicated.  PROCEDURES:  Critical Care performed: No  Procedures   MEDICATIONS ORDERED IN ED:  Medications - No  data to display   IMPRESSION / MDM / ASSESSMENT AND PLAN / ED COURSE   I have reviewed the triage note.  Differential diagnosis includes, but is not limited to, atopic dermatitis, contact dermatitis, bedbug, ring worm, toasted skin syndrome.  Patient's presentation is most consistent with acute, uncomplicated illness.  22 year old female presents to the emergency department for evaluation of discolored lesions on her back.  See HPI for further details.  Labs are overall reassuring.  Patient states that she sits in a heated seat in the car for long periods at a time and sometimes sleeps in the car with the seat on.  Based on the presentation and location of the discoloration this is likely a dermatitis related to direct contact with a heat source.  She will be encouraged to avoid long exposures.  If this is not improving over the next week or so, she is to see primary care or dermatology.      FINAL CLINICAL IMPRESSION(S) / ED DIAGNOSES   Final diagnoses:  Dermatitis     Rx / DC  Orders   ED Discharge Orders          Ordered    hydrocortisone 2.5 % ointment  2 times daily        02/25/23 2032             Note:  This document was prepared using Dragon voice recognition software and may include unintentional dictation errors.   Chinita Pester, FNP 02/25/23 2045    Dionne Bucy, MD 02/25/23 2351

## 2023-02-25 NOTE — Discharge Instructions (Addendum)
Follow-up with your primary care provider or the dermatologist if symptoms are not improving in about a week.  If symptoms change or worsen and you are unable to schedule appointment, please return to the emergency department.

## 2023-02-25 NOTE — ED Provider Triage Note (Signed)
Emergency Medicine Provider Triage Evaluation Note  Lisa Carney , a 22 y.o. female  was evaluated in triage.  Pt complains of bruising and questionable rash to the lower back. She states that it comes and goes. Mom noticed it tonight and was concerned because it looks different..  Physical Exam  BP 130/72   Pulse 90   Temp 98.9 F (37.2 C) (Oral)   Resp 16   SpO2 100%  Gen:   Awake, no distress   Resp:  Normal effort  MSK:   Moves extremities without difficulty  Other:  Area of discoloration over lower back without open or draining lesion.  Medical Decision Making  Medically screening exam initiated at 6:16 PM.  Appropriate orders placed.  Lisa Carney was informed that the remainder of the evaluation will be completed by another provider, this initial triage assessment does not replace that evaluation, and the importance of remaining in the ED until their evaluation is complete.     Chinita Pester, FNP 02/25/23 1819

## 2023-02-25 NOTE — ED Triage Notes (Signed)
Pt here for back pain for a month. Noticed a splotchy bruise on her lower back so decided to come in. Says she sometimes has itchiness. No known injuries.
